# Patient Record
Sex: Female | Born: 1965 | Race: White | Hispanic: No | Marital: Married | Smoking: Never smoker
Health system: Southern US, Community
[De-identification: ages and names within clinical notes are randomized; demographics above are authoritative.]

## PROBLEM LIST (undated history)

## (undated) DIAGNOSIS — M8580 Other specified disorders of bone density and structure, unspecified site: Secondary | ICD-10-CM

## (undated) DIAGNOSIS — H04123 Dry eye syndrome of bilateral lacrimal glands: Secondary | ICD-10-CM

## (undated) DIAGNOSIS — N3281 Overactive bladder: Secondary | ICD-10-CM

## (undated) DIAGNOSIS — Z319 Encounter for procreative management, unspecified: Secondary | ICD-10-CM

## (undated) DIAGNOSIS — R112 Nausea with vomiting, unspecified: Secondary | ICD-10-CM

## (undated) DIAGNOSIS — K449 Diaphragmatic hernia without obstruction or gangrene: Secondary | ICD-10-CM

## (undated) DIAGNOSIS — R7689 Other specified abnormal immunological findings in serum: Secondary | ICD-10-CM

## (undated) DIAGNOSIS — E079 Disorder of thyroid, unspecified: Secondary | ICD-10-CM

## (undated) DIAGNOSIS — K589 Irritable bowel syndrome without diarrhea: Secondary | ICD-10-CM

## (undated) DIAGNOSIS — K219 Gastro-esophageal reflux disease without esophagitis: Secondary | ICD-10-CM

## (undated) DIAGNOSIS — Z9889 Other specified postprocedural states: Secondary | ICD-10-CM

## (undated) DIAGNOSIS — R768 Other specified abnormal immunological findings in serum: Secondary | ICD-10-CM

## (undated) HISTORY — DX: Diaphragmatic hernia without obstruction or gangrene: K44.9

## (undated) HISTORY — PX: URETHRAL DILATION: SUR417

## (undated) HISTORY — DX: Disorder of thyroid, unspecified: E07.9

## (undated) HISTORY — DX: Encounter for procreative management, unspecified: Z31.9

## (undated) HISTORY — DX: Irritable bowel syndrome without diarrhea: K58.9

## (undated) HISTORY — PX: LAPAROSCOPY: SHX197

## (undated) HISTORY — PX: CERVIX SURGERY: SHX593

## (undated) HISTORY — DX: Other specified disorders of bone density and structure, unspecified site: M85.80

## (undated) HISTORY — PX: HERNIA REPAIR: SHX51

## (undated) HISTORY — DX: Dry eye syndrome of bilateral lacrimal glands: H04.123

## (undated) HISTORY — DX: Overactive bladder: N32.81

## (undated) HISTORY — DX: Other specified abnormal immunological findings in serum: R76.8

## (undated) HISTORY — PX: UPPER GASTROINTESTINAL ENDOSCOPY: SHX188

## (undated) HISTORY — DX: Irritable bowel syndrome, unspecified: K58.9

## (undated) HISTORY — DX: Gastro-esophageal reflux disease without esophagitis: K21.9

## (undated) HISTORY — DX: Other specified abnormal immunological findings in serum: R76.89

---

## 1997-08-09 ENCOUNTER — Ambulatory Visit (HOSPITAL_COMMUNITY): Admission: RE | Admit: 1997-08-09 | Discharge: 1997-08-09 | Payer: Self-pay | Admitting: Obstetrics & Gynecology

## 1997-09-25 ENCOUNTER — Emergency Department (HOSPITAL_COMMUNITY): Admission: EM | Admit: 1997-09-25 | Discharge: 1997-09-25 | Payer: Self-pay | Admitting: Emergency Medicine

## 1998-11-14 ENCOUNTER — Other Ambulatory Visit: Admission: RE | Admit: 1998-11-14 | Discharge: 1998-11-14 | Payer: Self-pay | Admitting: Obstetrics & Gynecology

## 1999-12-22 ENCOUNTER — Other Ambulatory Visit: Admission: RE | Admit: 1999-12-22 | Discharge: 1999-12-22 | Payer: Self-pay | Admitting: Obstetrics & Gynecology

## 2001-01-26 ENCOUNTER — Other Ambulatory Visit: Admission: RE | Admit: 2001-01-26 | Discharge: 2001-01-26 | Payer: Self-pay | Admitting: Obstetrics & Gynecology

## 2002-02-16 ENCOUNTER — Other Ambulatory Visit: Admission: RE | Admit: 2002-02-16 | Discharge: 2002-02-16 | Payer: Self-pay | Admitting: Obstetrics & Gynecology

## 2002-05-04 ENCOUNTER — Ambulatory Visit (HOSPITAL_COMMUNITY): Admission: RE | Admit: 2002-05-04 | Discharge: 2002-05-04 | Payer: Self-pay | Admitting: Internal Medicine

## 2002-05-04 ENCOUNTER — Encounter: Payer: Self-pay | Admitting: Internal Medicine

## 2003-07-08 ENCOUNTER — Encounter: Admission: RE | Admit: 2003-07-08 | Discharge: 2003-07-08 | Payer: Self-pay | Admitting: Internal Medicine

## 2004-04-01 ENCOUNTER — Other Ambulatory Visit: Admission: RE | Admit: 2004-04-01 | Discharge: 2004-04-01 | Payer: Self-pay | Admitting: Gynecology

## 2004-04-06 ENCOUNTER — Ambulatory Visit: Payer: Self-pay | Admitting: Gastroenterology

## 2004-04-22 ENCOUNTER — Ambulatory Visit: Payer: Self-pay | Admitting: Gastroenterology

## 2004-05-22 ENCOUNTER — Ambulatory Visit: Payer: Self-pay | Admitting: Gastroenterology

## 2004-06-23 ENCOUNTER — Ambulatory Visit: Payer: Self-pay | Admitting: Gastroenterology

## 2004-06-24 ENCOUNTER — Ambulatory Visit (HOSPITAL_BASED_OUTPATIENT_CLINIC_OR_DEPARTMENT_OTHER): Admission: RE | Admit: 2004-06-24 | Discharge: 2004-06-24 | Payer: Self-pay | Admitting: Urology

## 2004-06-24 ENCOUNTER — Ambulatory Visit (HOSPITAL_COMMUNITY): Admission: RE | Admit: 2004-06-24 | Discharge: 2004-06-24 | Payer: Self-pay | Admitting: Urology

## 2004-07-16 ENCOUNTER — Ambulatory Visit: Payer: Self-pay | Admitting: Family Medicine

## 2004-07-22 ENCOUNTER — Ambulatory Visit: Payer: Self-pay | Admitting: Family Medicine

## 2004-10-02 ENCOUNTER — Ambulatory Visit: Payer: Self-pay | Admitting: Family Medicine

## 2005-01-26 ENCOUNTER — Ambulatory Visit: Payer: Self-pay | Admitting: Family Medicine

## 2005-02-01 ENCOUNTER — Ambulatory Visit: Payer: Self-pay | Admitting: Internal Medicine

## 2005-04-27 ENCOUNTER — Other Ambulatory Visit: Admission: RE | Admit: 2005-04-27 | Discharge: 2005-04-27 | Payer: Self-pay | Admitting: Gynecology

## 2005-05-19 ENCOUNTER — Ambulatory Visit (HOSPITAL_COMMUNITY): Admission: RE | Admit: 2005-05-19 | Discharge: 2005-05-19 | Payer: Self-pay | Admitting: Family Medicine

## 2005-07-16 ENCOUNTER — Ambulatory Visit: Payer: Self-pay | Admitting: Family Medicine

## 2005-07-21 ENCOUNTER — Ambulatory Visit: Payer: Self-pay | Admitting: Family Medicine

## 2005-07-23 ENCOUNTER — Ambulatory Visit: Payer: Self-pay | Admitting: Family Medicine

## 2006-05-11 ENCOUNTER — Other Ambulatory Visit: Admission: RE | Admit: 2006-05-11 | Discharge: 2006-05-11 | Payer: Self-pay | Admitting: Gynecology

## 2006-05-20 ENCOUNTER — Ambulatory Visit (HOSPITAL_COMMUNITY): Admission: RE | Admit: 2006-05-20 | Discharge: 2006-05-20 | Payer: Self-pay | Admitting: Gynecology

## 2006-08-05 ENCOUNTER — Ambulatory Visit: Payer: Self-pay | Admitting: Family Medicine

## 2006-08-05 LAB — CONVERTED CEMR LAB
ALT: 14 units/L (ref 0–40)
AST: 18 units/L (ref 0–37)
Basophils Relative: 0.8 % (ref 0.0–1.0)
Bilirubin, Direct: 0.1 mg/dL (ref 0.0–0.3)
CO2: 30 meq/L (ref 19–32)
Calcium: 10.4 mg/dL (ref 8.4–10.5)
Chloride: 109 meq/L (ref 96–112)
Eosinophils Relative: 1.5 % (ref 0.0–5.0)
Glucose, Bld: 98 mg/dL (ref 70–99)
HCT: 39.3 % (ref 36.0–46.0)
HDL: 81.9 mg/dL (ref >39.0)
Lymphocytes Relative: 30.8 % (ref 12.0–46.0)
Platelets: 256 10*3/uL (ref 150–400)
RBC: 4.36 M/uL (ref 3.87–5.11)
Total Bilirubin: 0.7 mg/dL (ref 0.3–1.2)
Total Protein: 7.3 g/dL (ref 6.0–8.3)
Triglycerides: 69 mg/dL (ref 0–149)
VLDL: 14 mg/dL (ref 0–40)
WBC: 4.6 10*3/uL (ref 4.5–10.5)

## 2006-08-12 ENCOUNTER — Ambulatory Visit: Payer: Self-pay | Admitting: Family Medicine

## 2006-12-05 ENCOUNTER — Telehealth: Payer: Self-pay | Admitting: Family Medicine

## 2006-12-08 ENCOUNTER — Ambulatory Visit: Payer: Self-pay | Admitting: Family Medicine

## 2006-12-08 DIAGNOSIS — K219 Gastro-esophageal reflux disease without esophagitis: Secondary | ICD-10-CM | POA: Insufficient documentation

## 2007-03-30 HISTORY — PX: ABDOMINAL HYSTERECTOMY: SHX81

## 2007-05-22 ENCOUNTER — Other Ambulatory Visit: Admission: RE | Admit: 2007-05-22 | Discharge: 2007-05-22 | Payer: Self-pay | Admitting: Gynecology

## 2007-06-06 ENCOUNTER — Ambulatory Visit (HOSPITAL_COMMUNITY): Admission: RE | Admit: 2007-06-06 | Discharge: 2007-06-06 | Payer: Self-pay | Admitting: Gynecology

## 2007-09-13 ENCOUNTER — Ambulatory Visit: Payer: Self-pay | Admitting: Family Medicine

## 2007-09-13 LAB — CONVERTED CEMR LAB
Bilirubin Urine: NEGATIVE
Protein, U semiquant: NEGATIVE
Urobilinogen, UA: 0.2
WBC Urine, dipstick: NEGATIVE

## 2007-09-18 LAB — CONVERTED CEMR LAB
ALT: 18 units/L (ref 0–35)
AST: 21 units/L (ref 0–37)
Alkaline Phosphatase: 52 units/L (ref 39–117)
Basophils Absolute: 0 10*3/uL (ref 0.0–0.1)
Basophils Relative: 0 % (ref 0.0–1.0)
Bilirubin, Direct: 0.1 mg/dL (ref 0.0–0.3)
CO2: 29 meq/L (ref 19–32)
Chloride: 107 meq/L (ref 96–112)
Cholesterol: 191 mg/dL (ref 0–200)
Creatinine, Ser: 0.8 mg/dL (ref 0.4–1.2)
LDL Cholesterol: 107 mg/dL — ABNORMAL HIGH (ref 0–99)
Lymphocytes Relative: 32.6 % (ref 12.0–46.0)
MCHC: 34.2 g/dL (ref 30.0–36.0)
Neutrophils Relative %: 60.6 % (ref 43.0–77.0)
Potassium: 4.1 meq/L (ref 3.5–5.1)
RBC: 4.22 M/uL (ref 3.87–5.11)
Sodium: 141 meq/L (ref 135–145)
Total Bilirubin: 0.7 mg/dL (ref 0.3–1.2)
VLDL: 13 mg/dL (ref 0–40)

## 2007-09-27 ENCOUNTER — Ambulatory Visit: Payer: Self-pay | Admitting: Family Medicine

## 2007-09-27 DIAGNOSIS — M8580 Other specified disorders of bone density and structure, unspecified site: Secondary | ICD-10-CM

## 2007-09-28 ENCOUNTER — Ambulatory Visit: Payer: Self-pay | Admitting: Internal Medicine

## 2007-09-28 ENCOUNTER — Encounter: Payer: Self-pay | Admitting: Family Medicine

## 2008-06-06 ENCOUNTER — Ambulatory Visit (HOSPITAL_COMMUNITY): Admission: RE | Admit: 2008-06-06 | Discharge: 2008-06-06 | Payer: Self-pay | Admitting: Gynecology

## 2008-09-23 ENCOUNTER — Ambulatory Visit: Payer: Self-pay | Admitting: Family Medicine

## 2008-09-23 LAB — CONVERTED CEMR LAB
Blood in Urine, dipstick: NEGATIVE
Nitrite: NEGATIVE
Protein, U semiquant: NEGATIVE
Urobilinogen, UA: 0.2
WBC Urine, dipstick: NEGATIVE

## 2008-09-27 ENCOUNTER — Encounter: Payer: Self-pay | Admitting: Family Medicine

## 2008-09-27 LAB — CONVERTED CEMR LAB
AST: 21 units/L (ref 0–37)
Albumin: 4.1 g/dL (ref 3.5–5.2)
Alkaline Phosphatase: 53 units/L (ref 39–117)
Basophils Absolute: 0 10*3/uL (ref 0.0–0.1)
Bilirubin, Direct: 0.1 mg/dL (ref 0.0–0.3)
Calcium: 10.3 mg/dL (ref 8.4–10.5)
Creatinine, Ser: 0.8 mg/dL (ref 0.4–1.2)
Eosinophils Absolute: 0.1 10*3/uL (ref 0.0–0.7)
GFR calc non Af Amer: 82.95 mL/min (ref >60)
Glucose, Bld: 93 mg/dL (ref 70–99)
HDL: 82.8 mg/dL (ref >39.00)
Hemoglobin: 12.9 g/dL (ref 12.0–15.0)
Lymphocytes Relative: 18.3 % (ref 12.0–46.0)
Monocytes Relative: 5.9 % (ref 3.0–12.0)
Neutro Abs: 3.5 10*3/uL (ref 1.4–7.7)
Neutrophils Relative %: 74.5 % (ref 43.0–77.0)
RBC: 4.08 M/uL (ref 3.87–5.11)
RDW: 12.5 % (ref 11.5–14.6)
Sodium: 143 meq/L (ref 135–145)
Total CHOL/HDL Ratio: 2
Triglycerides: 61 mg/dL (ref 0.0–149.0)
VLDL: 12.2 mg/dL (ref 0.0–40.0)

## 2008-10-08 ENCOUNTER — Ambulatory Visit: Payer: Self-pay | Admitting: Family Medicine

## 2008-10-17 ENCOUNTER — Encounter: Payer: Self-pay | Admitting: Family Medicine

## 2008-12-30 ENCOUNTER — Encounter: Payer: Self-pay | Admitting: Family Medicine

## 2009-02-05 ENCOUNTER — Encounter (INDEPENDENT_AMBULATORY_CARE_PROVIDER_SITE_OTHER): Payer: Self-pay | Admitting: *Deleted

## 2009-03-07 ENCOUNTER — Encounter: Payer: Self-pay | Admitting: Family Medicine

## 2009-03-12 ENCOUNTER — Encounter (INDEPENDENT_AMBULATORY_CARE_PROVIDER_SITE_OTHER): Payer: Self-pay | Admitting: *Deleted

## 2009-03-31 ENCOUNTER — Encounter (INDEPENDENT_AMBULATORY_CARE_PROVIDER_SITE_OTHER): Payer: Self-pay | Admitting: *Deleted

## 2009-04-16 ENCOUNTER — Ambulatory Visit: Payer: Self-pay | Admitting: Family Medicine

## 2009-04-16 LAB — CONVERTED CEMR LAB
Bilirubin Urine: NEGATIVE
Blood in Urine, dipstick: NEGATIVE
Ketones, urine, test strip: NEGATIVE
Nitrite: NEGATIVE
Protein, U semiquant: NEGATIVE
Urobilinogen, UA: 0.2

## 2009-04-25 ENCOUNTER — Encounter: Payer: Self-pay | Admitting: Family Medicine

## 2009-05-26 ENCOUNTER — Encounter: Payer: Self-pay | Admitting: Family Medicine

## 2009-05-28 ENCOUNTER — Encounter (INDEPENDENT_AMBULATORY_CARE_PROVIDER_SITE_OTHER): Payer: Self-pay | Admitting: *Deleted

## 2009-05-30 ENCOUNTER — Ambulatory Visit: Payer: Self-pay | Admitting: Gastroenterology

## 2009-06-13 ENCOUNTER — Ambulatory Visit (HOSPITAL_COMMUNITY): Admission: RE | Admit: 2009-06-13 | Discharge: 2009-06-13 | Payer: Self-pay | Admitting: Gynecology

## 2009-06-23 ENCOUNTER — Telehealth: Payer: Self-pay | Admitting: Gastroenterology

## 2009-06-26 ENCOUNTER — Ambulatory Visit: Payer: Self-pay | Admitting: Gastroenterology

## 2009-06-26 HISTORY — PX: COLONOSCOPY: SHX174

## 2009-10-06 ENCOUNTER — Ambulatory Visit: Payer: Self-pay | Admitting: Family Medicine

## 2009-10-06 LAB — CONVERTED CEMR LAB
Bilirubin Urine: NEGATIVE
Blood in Urine, dipstick: NEGATIVE
Glucose, Urine, Semiquant: NEGATIVE
Ketones, urine, test strip: NEGATIVE
Protein, U semiquant: NEGATIVE
Urobilinogen, UA: 0.2
pH: 5.5

## 2009-10-08 ENCOUNTER — Encounter (INDEPENDENT_AMBULATORY_CARE_PROVIDER_SITE_OTHER): Payer: Self-pay

## 2009-10-08 LAB — CONVERTED CEMR LAB
Alkaline Phosphatase: 55 units/L (ref 39–117)
BUN: 14 mg/dL (ref 6–23)
Basophils Relative: 0.6 % (ref 0.0–3.0)
Bilirubin, Direct: 0.1 mg/dL (ref 0.0–0.3)
Calcium: 10.1 mg/dL (ref 8.4–10.5)
Chloride: 106 meq/L (ref 96–112)
Cholesterol: 214 mg/dL — ABNORMAL HIGH (ref 0–200)
Creatinine, Ser: 0.7 mg/dL (ref 0.4–1.2)
Direct LDL: 96.7 mg/dL
Eosinophils Absolute: 0 10*3/uL (ref 0.0–0.7)
Eosinophils Relative: 1 % (ref 0.0–5.0)
HDL: 91.9 mg/dL (ref >39.00)
Lymphocytes Relative: 26.2 % (ref 12.0–46.0)
MCV: 93.6 fL (ref 78.0–100.0)
Monocytes Absolute: 0.2 10*3/uL (ref 0.1–1.0)
Neutrophils Relative %: 66.9 % (ref 43.0–77.0)
Platelets: 217 10*3/uL (ref 150.0–400.0)
RBC: 3.94 M/uL (ref 3.87–5.11)
Total Bilirubin: 0.7 mg/dL (ref 0.3–1.2)
Total CHOL/HDL Ratio: 2
Triglycerides: 70 mg/dL (ref 0.0–149.0)
VLDL: 14 mg/dL (ref 0.0–40.0)
WBC: 4.5 10*3/uL (ref 4.5–10.5)

## 2009-10-13 ENCOUNTER — Ambulatory Visit: Payer: Self-pay | Admitting: Family Medicine

## 2009-10-14 ENCOUNTER — Encounter: Payer: Self-pay | Admitting: Family Medicine

## 2009-10-15 LAB — CONVERTED CEMR LAB: Vit D, 25-Hydroxy: 47 ng/mL (ref 30–89)

## 2010-04-30 NOTE — Progress Notes (Signed)
Summary: speak to nurse  Phone Note Call from Patient Call back at Home Phone [phone]   Caller: Patient Call For: Russella Dar Reason for Call: Talk to Nurse Summary of Call: Patient wants to speak to nurse regarding prep before procedure. Initial call taken by: Tawni Levy,  June 23, 2009 11:12 AM  Follow-up for Phone Call        No answer, will call later. Follow-up by: Wyona Almas RN,  June 23, 2009 11:34 AM  Additional Follow-up for Phone Call Additional follow up Details #1::        Phone Call Completed Additional Follow-up by: Wyona Almas RN,  June 23, 2009 1:00 PM

## 2010-04-30 NOTE — Letter (Signed)
Summary: [facility]  [facility]   Imported By: Maryln Gottron 06/10/2009 15:51:55  _____________________________________________________________________  External Attachment:    Type:   Image     Comment:   External Document

## 2010-04-30 NOTE — Letter (Signed)
Summary: [facility]  [facility]   Imported By: Maryln Gottron 04/03/2009 10:51:03  _____________________________________________________________________  External Attachment:    Type:   Image     Comment:   External Document

## 2010-04-30 NOTE — Procedures (Signed)
Summary: Colonoscopy  Patient: [name] Note: All result statuses are Final unless otherwise noted.  Tests: (1) Colonoscopy (COL)   COL Colonoscopy           DONE     [facility]     [address]     [place], [place]  [address]           COLONOSCOPY PROCEDURE REPORT           PATIENT:  Sherri Charles, Sherri Charles  MR#:  [id number]     BIRTHDATE:  1965-10-05, 43 yrs. old  GENDER:  female           ENDOSCOPIST:  Judie Petit T. Russella Dar, MD, [facility]           PROCEDURE DATE:  06/26/2009     PROCEDURE:  Colonoscopy 08657     ASA CLASS:  Class II     INDICATIONS:  1) Routine Risk Screening           MEDICATIONS:   Fentanyl 75 mcg IV, Versed 8 mg IV           DESCRIPTION OF PROCEDURE:   After the risks benefits and     alternatives of the procedure were thoroughly explained, informed     consent was obtained.  Digital rectal exam was performed and     revealed no abnormalities.   The LB [id number]-[id number] endoscope     was introduced through the anus and advanced to the cecum, which     was identified by both the appendix and ileocecal valve, without     limitations.  The quality of the prep was excellent, using     MoviPrep.  The instrument was then slowly withdrawn as the colon     was fully examined.     <<PROCEDUREIMAGES>>           FINDINGS:  A normal appearing cecum, ileocecal valve, and     appendiceal orifice were identified. The ascending, hepatic     flexure, transverse, splenic flexure, descending, sigmoid colon,     and rectum appeared unremarkable. Retroflexed views in the rectum     revealed no abnormalities. The time to cecum =  3  minutes. The     scope was then withdrawn (time =  8.25  min) from the patient and     the procedure completed.           COMPLICATIONS:  None           ENDOSCOPIC IMPRESSION:     1) Normal colon           RECOMMENDATIONS:     1) Continue current colorectal screening recommendations for     "routine risk" patients with a repeat colonoscopy in  10 years.           Venita Lick. Russella Dar, MD, [facility]           CC: Nelwyn Salisbury, MD           n.     Rosalie DoctorVenita Lick. Maryrose Colvin at 06/26/2009 12:06 PM           Oliva Bustard, [id number]  Note: An exclamation mark (!) indicates a result that was not dispersed into the flowsheet. Document Creation Date: 06/26/2009 12:06 PM _______________________________________________________________________  (1) Order result status: Final Collection or observation date-time: 06/26/2009 12:03 Requested date-time:  Receipt date-time:  Reported date-time:  Referring Physician:   Ordering Physician: Claudette Head [phone]) Specimen Source:  Source: Launa Grill Order Number: [id number] Lab site:   Appended Document: Colonoscopy    Clinical Lists Changes  Observations: Added new observation of COLONNXTDUE: 05/2019 (06/26/2009 12:50)

## 2010-04-30 NOTE — Letter (Signed)
Summary: [facility] Instructions  [facility]  [address] [place], [place] [address]   Phone: [phone]  Fax: [phone]       Sherri Charles    September 09, 1965    MRN: [id number]        Procedure Day /Date: Thursday 06/26/2009     Arrival Time: 10:30 am      Procedure Time: 11:30 am     Location of Procedure:                    _x _  [facility] (4th Floor)                        PREPARATION FOR COLONOSCOPY WITH MOVIPREP   Starting 5 days prior to your procedure Saturday 3/26 do not eat nuts, seeds, popcorn, corn, beans, peas,  salads, or any raw vegetables.  Do not take any fiber supplements (e.g. Metamucil, Citrucel, and Benefiber).  THE DAY BEFORE YOUR PROCEDURE         DATE: Wednesday 3/30  1.  Drink clear liquids the entire day-NO SOLID FOOD  2.  Do not drink anything colored red or purple.  Avoid juices with pulp.  No orange juice.  3.  Drink at least 64 oz. (8 glasses) of fluid/clear liquids during the day to prevent dehydration and help the prep work efficiently.  CLEAR LIQUIDS INCLUDE: Water Jello Ice Popsicles Tea (sugar ok, no milk/cream) Powdered fruit flavored drinks Coffee (sugar ok, no milk/cream) Gatorade Juice: apple, white grape, white cranberry  Lemonade Clear bullion, consomm, broth Carbonated beverages (any kind) Strained chicken noodle soup Hard Candy                             4.  In the morning, mix first dose of MoviPrep solution:    Empty 1 Pouch A and 1 Pouch B into the disposable container    Add lukewarm drinking water to the top line of the container. Mix to dissolve    Refrigerate (mixed solution should be used within 24 hrs)  5.  Begin drinking the prep at 5:00 p.m. The MoviPrep container is divided by 4 marks.   Every 15 minutes drink the solution down to the next mark (approximately 8 oz) until the full liter is complete.   6.  Follow completed prep with 16 oz of clear liquid of your choice  (Nothing red or purple).  Continue to drink clear liquids until bedtime.  7.  Before going to bed, mix second dose of MoviPrep solution:    Empty 1 Pouch A and 1 Pouch B into the disposable container    Add lukewarm drinking water to the top line of the container. Mix to dissolve    Refrigerate  THE DAY OF YOUR PROCEDURE      DATE: Thursday 3/31  Beginning at 6:30 a.m. (5 hours before procedure):         1. Every 15 minutes, drink the solution down to the next mark (approx 8 oz) until the full liter is complete.  2. Follow completed prep with 16 oz. of clear liquid of your choice.    3. You may drink clear liquids until 9:30 am (2 HOURS BEFORE PROCEDURE).   MEDICATION INSTRUCTIONS  Unless otherwise instructed, you should take regular prescription medications with a small sip of water   as early as possible the morning of your  procedure.        OTHER INSTRUCTIONS  You will need a responsible adult at least 46 years of age to accompany you and drive you home.   This person must remain in the waiting room during your procedure.  Wear loose fitting clothing that is easily removed.  Leave jewelry and other valuables at home.  However, you may wish to bring a book to read or  an iPod/MP3 player to listen to music as you wait for your procedure to start.  Remove all body piercing jewelry and leave at home.  Total time from sign-in until discharge is approximately 2-3 hours.  You should go home directly after your procedure and rest.  You can resume normal activities the  day after your procedure.  The day of your procedure you should not:   Drive   Make legal decisions   Operate machinery   Drink alcohol   Return to work  You will receive specific instructions about eating, activities and medications before you leave.    The above instructions have been reviewed and explained to me by   Clide Cliff, RN_______________________    I fully understand and can  verbalize these instructions _____________________________ Date _________

## 2010-04-30 NOTE — Assessment & Plan Note (Signed)
Summary: consult re: recurring pain in lower back and hip area/over a ...   Vital Signs:  Patient profile:   45 year old female Weight:      105 pounds Temp:     98.4 degrees F oral Pulse rate:   94 / minute BP sitting:   102 / 70  (left arm) Cuff size:   regular  Vitals Entered By: Alfred Levins, CMA (April 16, 2009 2:37 PM) CC: rt side pain no better   History of Present Illness: Here for 2 different pains that have been  bothering her for 6 months off and on. It started with a sharp burning pain in the right lateral thigh that still hurts, especially when she lies on it. No trauma hx. Then she began to have a more dull aching pain in the lower right back area that comes and goes. No pain or numbness down the lower leg. Motrin does not help much.   Allergies: 1)  ! Sulfa  Past History:  Past Medical History: Reviewed history from 09/27/2007 and no changes required. GERD sees Dr. Chevis Pretty for gyn exams overactive bladder, sees Dr. Earlene Plater IBS infertility, sees Dr. Chevis Pretty dry eyes, sees Dr. Marti Sleigh sees Dr. Jorja Loa for skin checks osteopenia per DEXA on 02-01-05  Past Surgical History: Reviewed history from 10/08/2008 and no changes required. Laporoscopy x 2 Lesion removed from cervix right inguinal Hernia repair urethral dilatation times two (last 5-10) EGD 04-22-04 per Dr. Russella Dar, showed esophagitis colonoscopy 04-22-04 per Dr. Russella Dar, had polyps, repeat in 5 years TAH and left SO 06-20-07 per Dr. Randell Patient  Review of Systems  The patient denies anorexia, fever, weight loss, weight gain, vision loss, decreased hearing, hoarseness, chest pain, syncope, dyspnea on exertion, peripheral edema, prolonged cough, headaches, hemoptysis, abdominal pain, melena, hematochezia, severe indigestion/heartburn, hematuria, incontinence, genital sores, muscle weakness, suspicious skin lesions, transient blindness, difficulty walking, depression, unusual weight change, abnormal bleeding, enlarged lymph  nodes, angioedema, breast masses, and testicular masses.    Physical Exam  General:  Well-developed,well-nourished,in no acute distress; alert,appropriate and cooperative throughout examination. Walks easily Msk:  No deformity or scoliosis noted of thoracic or lumbar spine.  No tenderness in the lower back at all, with full ROM. Both hips show full ROM without pain. She is quite tender over the right lateral thigh.    Impression & Recommendations:  Problem # 1:  GREATER TROCHANTERIC BURSITIS (ICD-726.5)  Orders: Orthopedic Referral (Ortho)  Problem # 2:  BACK PAIN, LUMBAR (ICD-724.2)  Complete Medication List: 1)  Multivitamins Tabs (Multiple vitamin) .Marland Kitchen.. 1 by mouth once daily 2)  Calcium Carbonate Powd (Calcium carbonate) .Marland Kitchen.. 1 by mouth two times a day 3)  Restasis 0.05 % Emul (Cyclosporine) .... 2 drops each two times a day 4)  Fish Oil Oil (Fish oil) .... Once daily  Other Orders: UA Dipstick w/o Micro (manual) (40981)  Patient Instructions: 1)  I think the original problem is the trochanteric bursitis, then she developed low back pain from an altered gait ti favor the right hip. rest , heat, Motrin. Will refer to Orthopedics for a cortisone injection in the bursa.  Laboratory Results   Urine Tests    Routine Urinalysis   Color: yellow Appearance: Clear Glucose: negative   (Normal Range: Negative) Bilirubin: negative   (Normal Range: Negative) Ketone: negative   (Normal Range: Negative) Spec. Gravity: 1.015   (Normal Range: 1.003-1.035) Blood: negative   (Normal Range: Negative) pH: 7.5   (Normal Range: 5.0-8.0) Protein: negative   (  Normal Range: Negative) Urobilinogen: 0.2   (Normal Range: 0-1) Nitrite: negative   (Normal Range: Negative) Leukocyte Esterace: negative   (Normal Range: Negative)    Comments: Rita Ohara  April 16, 2009 3:03 PM

## 2010-04-30 NOTE — Consult Note (Signed)
Summary: [facility]  [facility]   Imported By: Maryln Gottron 05/02/2009 13:21:51  _____________________________________________________________________  External Attachment:    Type:   Image     Comment:   External Document

## 2010-04-30 NOTE — Letter (Signed)
Summary: Previsit letter  [facility]  [address] [place], [place] [address]   Phone: [phone]  Fax: [phone]       03/31/2009 MRN: [id number]  [facility] [address] [place], [place]  [address]  Dear Ms. Sherri Charles,  Welcome to the Gastroenterology Division at [facility].    You are scheduled to see a nurse for your pre-procedure visit on 05/01/2009 at 3:00pm on the 3rd floor at [facility], [address]  We ask that you try to arrive at our office 15 minutes prior to your appointment time to allow for check-in.  Your nurse visit will consist of discussing your medical and surgical history, your immediate family medical history, and your medications.    Please bring a complete list of all your medications or, if you prefer, bring the medication bottles and we will list them.  We will need to be aware of both prescribed and over the counter drugs.  We will need to know exact dosage information as well.  If you are on blood thinners (Coumadin, Plavix, Aggrenox, Ticlid, etc.) please call our office today/prior to your appointment, as we need to consult with your physician about holding your medication.   Please be prepared to read and sign documents such as consent forms, a financial agreement, and acknowledgement forms.  If necessary, and with your consent, a friend or relative is welcome to sit-in on the nurse visit with you.  Please bring your insurance card so that we may make a copy of it.  If your insurance requires a referral to see a specialist, please bring your referral form from your primary care physician.  No co-pay is required for this nurse visit.     If you cannot keep your appointment, please call [phone] to cancel or reschedule prior to your appointment date.  This allows [place] the opportunity to schedule an appointment for another patient in need of care.    Thank you for choosing [facility] for your medical needs.  We  appreciate the opportunity to care for you.  Please visit [place] at our website  to learn more about our practice.                     Sincerely.                                                                                                                   The Gastroenterology Division

## 2010-04-30 NOTE — Letter (Signed)
Summary: Generic Letter  [place] [facility]  [address] [place], [place] [address]   Phone: [phone]  Fax: [phone]    10/08/2009  Akayla Mullings [address] [place], [place]  [address]  Dear Ms. Phylliss Bob,           Sincerely,   Tomma Lightning, RN

## 2010-04-30 NOTE — Miscellaneous (Signed)
Summary: RECALL COLON/YF  Clinical Lists Changes  Medications: Added new medication of MOVIPREP 100 GM  SOLR (PEG-KCL-NACL-NASULF-NA ASC-C) As directed - Signed Rx of MOVIPREP 100 GM  SOLR (PEG-KCL-NACL-NASULF-NA ASC-C) As directed;  #1 x 0;  Signed;  Entered by: Clide Cliff RN;  Authorized by: Meryl Dare MD [facility] Graham;  Method used: Electronically to [facility] [phone]*, [address], [place], [place]  [address], Ph: [phone], Fax: [phone] Allergies: Changed allergy or adverse reaction from SULFA to SULFA    Prescriptions: MOVIPREP 100 GM  SOLR (PEG-KCL-NACL-NASULF-NA ASC-C) As directed  #1 x 0   Entered by:   Clide Cliff RN   Authorized by:   Meryl Dare MD [facility]   Signed by:   Clide Cliff RN on 05/30/2009   Method used:   Electronically to        [facility] [phone]* (retail)       [address] [place], [place]  [address]       Ph: [phone]       Fax: [phone]   RxID:   [id number]

## 2010-04-30 NOTE — Assessment & Plan Note (Signed)
Summary: CPX // RS   Vital Signs:  Patient profile:   45 year old female Weight:      107 pounds BMI:     21.15 BP sitting:   100 / 64  (left arm) Cuff size:   regular  Vitals Entered By: Raechel Ache, RN (October 13, 2009 2:50 PM) CC: CPX, labs done, sees gyn. C/o trouble sleeping and overactive bladder at noc.   History of Present Illness: 45 yr old female for a cpx. She feels good in general but she does mention some mild muscle aches that can wake her up at night sometimes. These do not bother her during the day.   Allergies: 1)  ! Sulfa  Past History:  Past Medical History: GERD sees Dr. Chevis Pretty for gyn exams overactive bladder, sees Dr. Earlene Plater IBS infertility, sees Dr. Chevis Pretty dry eyes, sees Dr. Marti Sleigh sees Dr. Jorja Loa for skin checks osteopenia per DEXA on 09-28-07  Past Surgical History: Laporoscopy x 2 Lesion removed from cervix right inguinal Hernia repair urethral dilatation times two (last 5-10) EGD 04-22-04 per Dr. Russella Dar, showed esophagitis colonoscopy 06-26-09 per Dr. Russella Dar, clear, repeat in 10 yrs TAH and left SO 06-20-07 per Dr. Randell Patient  Family History: Reviewed history from 09/27/2007 and no changes required. Family History of Alcoholism/Addiction Family History of Arthritis Family History of CAD Female 1st degree relative <60 Family History Hypertension  Social History: Reviewed history from 09/27/2007 and no changes required. Married Never Smoked Alcohol use-yes adopted a child from [place] 2008  Review of Systems  The patient denies anorexia, fever, weight loss, weight gain, vision loss, decreased hearing, hoarseness, chest pain, syncope, dyspnea on exertion, peripheral edema, prolonged cough, headaches, hemoptysis, abdominal pain, melena, hematochezia, severe indigestion/heartburn, hematuria, incontinence, genital sores, muscle weakness, suspicious skin lesions, transient blindness, difficulty walking, depression, unusual weight change, abnormal  bleeding, enlarged lymph nodes, angioedema, breast masses, and testicular masses.    Physical Exam  General:  Well-developed,well-nourished,in no acute distress; alert,appropriate and cooperative throughout examination Head:  Normocephalic and atraumatic without obvious abnormalities. No apparent alopecia or balding. Eyes:  No corneal or conjunctival inflammation noted. EOMI. Perrla. Funduscopic exam benign, without hemorrhages, exudates or papilledema. Vision grossly normal. Ears:  External ear exam shows no significant lesions or deformities.  Otoscopic examination reveals clear canals, tympanic membranes are intact bilaterally without bulging, retraction, inflammation or discharge. Hearing is grossly normal bilaterally. Nose:  External nasal examination shows no deformity or inflammation. Nasal mucosa are pink and moist without lesions or exudates. Mouth:  Oral mucosa and oropharynx without lesions or exudates.  Teeth in good repair. Neck:  No deformities, masses, or tenderness noted. Chest Wall:  No deformities, masses, or tenderness noted. Lungs:  Normal respiratory effort, chest expands symmetrically. Lungs are clear to auscultation, no crackles or wheezes. Heart:  Normal rate and regular rhythm. S1 and S2 normal without gallop, murmur, click, rub or other extra sounds. Abdomen:  Bowel sounds positive,abdomen soft and non-tender without masses, organomegaly or hernias noted. Msk:  No deformity or scoliosis noted of thoracic or lumbar spine.   Pulses:  R and L carotid,radial,femoral,dorsalis pedis and posterior tibial pulses are full and equal bilaterally Extremities:  No clubbing, cyanosis, edema, or deformity noted with normal full range of motion of all joints.   Neurologic:  No cranial nerve deficits noted. Station and gait are normal. Plantar reflexes are down-going bilaterally. DTRs are symmetrical throughout. Sensory, motor and coordinative functions appear intact. Skin:  Intact  without suspicious lesions or  rashes Cervical Nodes:  No lymphadenopathy noted Axillary Nodes:  No palpable lymphadenopathy Inguinal Nodes:  No significant adenopathy Psych:  Cognition and judgment appear intact. Alert and cooperative with normal attention span and concentration. No apparent delusions, illusions, hallucinations   Impression & Recommendations:  Problem # 1:  WELL ADULT EXAM (ICD-V70.0)  Orders: Venipuncture ([address]) T-Vitamin D (25-Hydroxy) (60454-09811)  Complete Medication List: 1)  Multivitamins Tabs (Multiple vitamin) .Marland Kitchen.. 1 by mouth once daily 2)  Calcium Carbonate Powd (Calcium carbonate) .Marland Kitchen.. 1 by mouth two times a day 3)  Restasis 0.05 % Emul (Cyclosporine) .... 2 drops each two times a day 4)  Fish Oil Oil (Fish oil) .... 2 two times a day  Patient Instructions: 1)  Check a vitamin D level.  2)  Please schedule a follow-up appointment in 1 year.    Preventive Care Screening  Mammogram:    Date:  06/27/2009    Results:  normal

## 2010-07-28 LAB — HM MAMMOGRAPHY

## 2010-07-30 ENCOUNTER — Other Ambulatory Visit (HOSPITAL_COMMUNITY): Payer: Self-pay | Admitting: Gynecology

## 2010-08-03 ENCOUNTER — Other Ambulatory Visit: Payer: Self-pay | Admitting: Gynecology

## 2010-08-03 DIAGNOSIS — R928 Other abnormal and inconclusive findings on diagnostic imaging of breast: Secondary | ICD-10-CM

## 2010-08-04 ENCOUNTER — Other Ambulatory Visit (HOSPITAL_COMMUNITY): Payer: Self-pay

## 2010-08-04 ENCOUNTER — Other Ambulatory Visit: Payer: Self-pay

## 2010-08-06 ENCOUNTER — Ambulatory Visit (HOSPITAL_COMMUNITY)
Admission: RE | Admit: 2010-08-06 | Discharge: 2010-08-06 | Disposition: A | Discharge status: Home / Self Care | Payer: BC Managed Care – PPO | Source: Ambulatory Visit | Attending: Gynecology | Admitting: Gynecology

## 2010-08-06 DIAGNOSIS — E042 Nontoxic multinodular goiter: Secondary | ICD-10-CM | POA: Insufficient documentation

## 2010-08-10 ENCOUNTER — Ambulatory Visit
Admission: RE | Admit: 2010-08-10 | Discharge: 2010-08-10 | Disposition: A | Discharge status: Home / Self Care | Payer: BC Managed Care – PPO | Source: Ambulatory Visit | Attending: Gynecology | Admitting: Gynecology

## 2010-08-10 DIAGNOSIS — R928 Other abnormal and inconclusive findings on diagnostic imaging of breast: Secondary | ICD-10-CM

## 2010-08-11 NOTE — Assessment & Plan Note (Signed)
[facility] OFFICE NOTE   PORSCHE, NOGUCHI                         MRN:          [id number]  DATE:08/12/2006                            DOB:          July 17, 1965    This is a 45 year old woman here for a non-gynecological physical  examination.  She has a couple of issues to discuss.  First off, she has  been trying to get some exercise and has been walking on a treadmill a  lot.  She frequently gets some swelling and pain along both anterior  lower legs and she thinks it may be shin splints.  She has done nothing  about it thus far.  Also, she had been on Nexium in the past with good  control of acid reflux.  She recently tried some over-the-counter  omeprazole, but began having a lot of problems with burning and dryness  of her eyes.  She recently saw her optometrist, who suggested that this  was a common side of omeprazole. She wonders if she should change back  to Nexium.  Lastly, she has frequent mild pains diffusely in her body,  especially in the neck, the lower back, the hips and the legs.  She  takes Tylenol for it with mixed results.  Otherwise, she and her husband  are still waiting to adopt a baby from [place].  They hope that later this  year this process will move forward and they can actually travel to  [place] for the next step of the process.  She continues to see Dr. Earlene Plater  on a regular basis for urologic concerns and she sees Dr. Jackelyn Knife for  GYN concerns.  For other details of her past medical history, family  history, social history, habits, etc., refer to our last physical noted  dated July 23, 2005.   ALLERGIES:  SULFA   CURRENT MEDICATIONS:  1. Calcium and vitamin D daily.  2. Multivitamins daily.  3. Omeprazole over-the-counter as needed.   OBJECTIVE:  Height 5 foot 1 inch, weight 112. BP 118/64, pulse 88 and  regular.  In general she appears to be healthy.  SKIN:  Is clear.  EYES:   Clear.  EARS:  Clear.  PHARYNX:  Clear.  NECK:  Supple without lymphadenopathy or masses.  LUNGS:  Clear.  CARDIAC:  Rate and rhythm regular without gallops, murmurs, rubs.  Distal pulses are full.  ABDOMEN:  Soft, normal bowel sounds, nontender, no masses.  EXTREMITIES:  No clubbing, cyanosis or edema.  No particular tenderness  along her shins either.  NEUROLOGIC:  Exam is grossly intact.   She was here for fasting labs on May 9, these were all within normal  limits.   ASSESSMENT AND PLAN:  1. Complete physical exam.  She seems to be doing quite well.  I      encouraged her exercise routine, but will alter it as noted below.  2. Arthralgias.  She can take Aleve on an as needed basis and follow      up p.r.n.  3. Gastroesophageal reflux disease.  Will get back on Nexium 40 mg      once daily.  4. Dry eyes, possibly due to omeprazole, hopefully this will improve      when we get back to Nexium as noted above.  5. Shin splints.  I advised alternative forms of exercising other than      walking, such as riding a stationary bicycle or an elliptical      machine.     Tera Mater. Clent Ridges, MD  Electronically Signed    SAF/MedQ  DD: 08/12/2006  DT: 08/12/2006  Job #: [id number]

## 2010-08-12 ENCOUNTER — Encounter: Payer: Self-pay | Admitting: Family Medicine

## 2010-08-12 ENCOUNTER — Ambulatory Visit (INDEPENDENT_AMBULATORY_CARE_PROVIDER_SITE_OTHER): Payer: BC Managed Care – PPO | Admitting: Family Medicine

## 2010-08-12 VITALS — BP 110/76 | Temp 99.1°F | Ht 60.0 in | Wt 101.0 lb

## 2010-08-12 DIAGNOSIS — E042 Nontoxic multinodular goiter: Secondary | ICD-10-CM

## 2010-08-12 LAB — T4, FREE: Free T4: 0.88 ng/dL (ref 0.60–1.60)

## 2010-08-12 LAB — T3, FREE: T3, Free: 2.4 pg/mL (ref 2.3–4.2)

## 2010-08-12 MED ORDER — TEMAZEPAM 7.5 MG PO CAPS: 7.5000 mg | ORAL_CAPSULE | Freq: Every evening | ORAL | Status: DC | PRN

## 2010-08-12 NOTE — Progress Notes (Signed)
  Subjective:    Patient ID: Sherri Charles, female    DOB: December 18, 1965, 45 y.o.   MRN: [id number]  HPI Here to discuss the recent discovery of thyroid nodules. She had a cpx with me last July, and we found no abnormalities of her thyroid at that time. Her weight then was 107 lbs. Then she saw Dr. Chevis Pretty several weeks ago for her GYN exam, and he found that she had an enlarged thyroid. She had an [place] on 08-06-10 which showed both lobes to be a bit enlarged, with 3 distinct nodules in the left lobe (the largest of which was 10x11x12 mm) and a complex of nodules on the right measuring 5x13x15 mm. Dr. Chevis Pretty then asked her to see me. She does admit to more fatigue in the past 6 months than usual and more trouble sleeping. She has lost about 6 lbs since since last July, although she has been trying to eat a healthier diet. No sweats or shakiness or diarrhea or palpitations.    Review of Systems  Constitutional: Positive for fatigue and unexpected weight change.  HENT: Negative.   Eyes: Negative.   Respiratory: Negative.   Cardiovascular: Negative.   Gastrointestinal: Negative.        Objective:   Physical Exam  Constitutional: She appears well-developed and well-nourished.  Neck:       Her thyroid is indeed mildly enlarged in both lobes. A nodule can be felt in the right lobe near the isthmus. There is no tenderness   Cardiovascular: Normal rate, regular rhythm, normal heart sounds and intact distal pulses.   Pulmonary/Chest: Effort normal and breath sounds normal.  Lymphadenopathy:    She has no cervical adenopathy.          Assessment & Plan:  This is most likely a multinodular goiter, which may be toxic. I explained that the majority of these are benign, but the possibility of neoplasm most be considered. We will get a full thyroid panel today and refer her to Endocrine for further workup.

## 2010-08-14 NOTE — Progress Notes (Signed)
Left message on machine for patient  With lab results

## 2010-08-14 NOTE — Op Note (Signed)
NAMESUNI, JARNAGIN                  ACCOUNT NO.:  [id number]   MEDICAL RECORD NO.:  [id number]          PATIENT TYPE:  AMB   LOCATION:  [facility]                         FACILITY:  [facility]   PHYSICIAN:  Ronald L. Earlene Plater, M.D.  DATE OF BIRTH:  June 18, 1965   DATE OF PROCEDURE:  06/24/2004  DATE OF DISCHARGE:                                 OPERATIVE REPORT   PREOPERATIVE DIAGNOSIS:  Urethritis with urethral stenosis.   OPERATION/PROCEDURE:  1.  Cystourethroscopy.  2.  Urethral dilatation to 32-French.   SURGEON:  Lucrezia Starch. Earlene Plater, M.D.   ANESTHESIA:  LMA.   ESTIMATED BLOOD LOSS:  Negligible.   TUBES:  None.   COMPLICATIONS:  None.   INDICATIONS FOR PROCEDURE:  Mrs. Grey is a lovely 45 year old white female  who presented with a two-year history of frequency of urination, especially  in the evening. She has nocturia x5-6, has significant decreased flow of  urine.  Also has irritable bowel syndrome and difficulty emptying her  bladder.  At the office, she was found to have a pinpoint urethra.  It could  not be catheterized and after understanding the risks, benefits and  alternatives, she elected to proceed with cystoscopy and urethral  dilatation.   DESCRIPTION OF PROCEDURE:  The patient was placed in the supine position  after proper LMA anesthesia.  She was placed in the dorsal lithotomy  position and prepped and draped in the with Betadine in the sterile fashion.  Urethra was noted to be essentially pinhole and was dilated with female  sounds to 32-French.  There was some slight oozing noted.  Cystourethroscopy  was then performed. The bladder was noted to be smooth-walled, effluxing  clear urine was noted in the normally placed ureteral orifices bilaterally.  Both the 12-degree and 70-degree lenses were utilized.  The urethra had some  circumferential inflammatory polyps consistent with the urethritis.  The  bladder was drained and the panendoscope was removed. The patient was  taken  to the recovery room stable.   PLAN:  She was given Uracid.  We will watch her.  Possibly antibiotic  therapy should it persist.  She will see [place] as needed.      RLD/MEDQ  D:  06/24/2004  T:  06/24/2004  Job:  [id number]   cc:   Leatha Gilding. Mezer, M.D.  [address]  [place]  [place] [address]  Fax: [phone]

## 2010-08-19 ENCOUNTER — Encounter: Payer: Self-pay | Admitting: *Deleted

## 2010-08-21 ENCOUNTER — Ambulatory Visit (INDEPENDENT_AMBULATORY_CARE_PROVIDER_SITE_OTHER): Payer: BC Managed Care – PPO | Admitting: Endocrinology

## 2010-08-21 ENCOUNTER — Encounter: Payer: Self-pay | Admitting: Endocrinology

## 2010-08-21 VITALS — BP 120/68 | HR 112 | Temp 98.7°F | Ht 60.0 in | Wt 100.6 lb

## 2010-08-21 DIAGNOSIS — E042 Nontoxic multinodular goiter: Secondary | ICD-10-CM

## 2010-08-21 NOTE — Progress Notes (Signed)
Subjective:    Patient ID: Sherri Charles, female    DOB: 27-Aug-1965, 45 y.o.   MRN: [id number]  HPI Pt was at her annual appt with dr Chevis Pretty, when she was noted to have a goiter.  She reports 1 week of slight fullness at the anterior neck, and assoc slight discomfort. Past Medical History  Diagnosis Date  . GERD (gastroesophageal reflux disease)   . Overactive bladder     sees Dr. Earlene Plater  . IBS (irritable bowel syndrome)   . Infertility management     sees Dr. Teodora Medici  . Dry eyes     sees Dr. Marti Sleigh  . Osteopenia     per DEXA 09-28-07    Past Surgical History  Procedure Date  . Laparoscopy     x 2  . Cervix surgery     lesion removed from cervix  . Hernia repair     right inguinal   . Urethral dilation     x two, (last 5-10)  . Esophagogastroduodenoscopy 2006    per Dr. Russella Dar, esophagitis  . Colonoscopy 06-26-09    per Dr. Chestine Spore, repeat in 10 yrs  . Abdominal hysterectomy 2009    TAH and left SO per Dr. Randell Patient    History   Social History  . Marital Status: Married    Spouse Name: N/A    Number of Children: N/A  . Years of Education: N/A   Occupational History  . Designer, fashion/clothing   Social History Main Topics  . Smoking status: Never Smoker   . Smokeless tobacco: Not on file  . Alcohol Use: Yes     occasionally  . Drug Use:   . Sexually Active:    Other Topics Concern  . Not on file   Social History Narrative   Adopted a child from [place] 2008    Current Outpatient Prescriptions on File Prior to Visit  Medication Sig Dispense Refill  . Calcium Carbonate-Vitamin D (CALCIUM 600 + D PO) Take 1 tablet by mouth 2 (two) times daily.       . fish oil-omega-3 fatty acids 1000 MG capsule 4000mg  daily      . multivitamin (THERAGRAN) per tablet Take 1 tablet by mouth daily.        Marland Kitchen nystatin-triamcinolone (MYCOLOG II) cream as needed.       . temazepam (RESTORIL) 7.5 MG capsule Take 1 capsule (7.5 mg total) by mouth at bedtime as needed for sleep.  30 capsule  2     Allergies  Allergen Reactions  . Sulfonamide Derivatives     REACTION: rash    Family History  Problem Relation Age of Onset  . Alcohol abuse Father   . Arthritis Father   . Heart disease Father   . Hypertension Father   . Cancer Other     Ovarian Cancer, Breast Cancer (Other relative)  father had thyroidect at age 75 (benign)  BP 120/68  Pulse 112  Temp(Src) 98.7 F (37.1 C) (Oral)  Ht 5' (1.524 m)  Wt 100 lb 9.6 oz (45.632 kg)  BMI 19.65 kg/m2  SpO2 97%    Review of Systems She has a slight cough, headache, freq bm's, easy bruising, and insomnia.  She has lost 6 lbs, since 2011.  denies hoarseness, double vision, palpitations, sob, myalgias, excessive diaphoresis, numbness, tremor, anxiety, hypoglycemia, and rhinorrhea. She attributes urinary frequency to oab.    Objective:   Physical Exam VS: see vs page GEN: no distress HEAD: head: no  deformity eyes: no periorbital swelling, no proptosis external nose and ears are normal mouth: no abnormality is seen NECK: supple, thyroid is slight enlarged, if at all.  The surface is slightly irregular, but i do not appreciate any discrete nodule.   CHEST WALL: no deformity. CV: tachycardic rate, but reg rhythm, no murmur. MUSCULOSKELETAL: muscle bulk and strength are grossly normal.  no obvious joint swelling.  gait is normal and steady EXTEMITIES: no deformity.  no ulcer on the feet.  feet are of normal color and temp.  no edema PULSES: dorsalis pedis intact bilat.   NEURO:  cn 2-12 grossly intact.   readily moves all 4's.  sensation is intact to touch on the feet.  No tremor. SKIN:  Normal texture and temperature.  No rash or suspicious lesion is visible.   NODES:  None palpable at the neck PSYCH: alert, oriented x3.  Does not appear anxious nor depressed.      Lab Results  Component Value Date   TSH 1.05 08/12/2010   THYROID ULTRASOUND Rght thyroid lobe:  9 x 14 x 38 mm   Left thyroid lobe:  11 x 14 x 38 mm    Isthmus:  2 mm in total thickness    Focal nodules:  5 x 13 x 15 mm complex, junction isthmus and right   lobe   4 x 5 x 8 mm hypoechoic, superior left   10 x 11 x 12 mm complex, inferior left   3 x 5 x 8 mm complex, left isthmus    Lymphadenopathy:  None visualized.    IMPRESSION: Four small thyroid nodules  Assessment & Plan:  Very small multinodular goiter, new Sensation of fullness at the anterior neck, not thyroid-related Headache, and other thyroid sxs, not thyroid-related.

## 2010-08-21 NOTE — Patient Instructions (Signed)
You should have another thyroid ultrasound in 6-12 months.   return here as needed.

## 2010-08-24 DIAGNOSIS — E042 Nontoxic multinodular goiter: Secondary | ICD-10-CM | POA: Insufficient documentation

## 2010-10-16 ENCOUNTER — Other Ambulatory Visit (INDEPENDENT_AMBULATORY_CARE_PROVIDER_SITE_OTHER): Payer: BC Managed Care – PPO

## 2010-10-16 DIAGNOSIS — Z0000 Encounter for general adult medical examination without abnormal findings: Secondary | ICD-10-CM

## 2010-10-16 LAB — CBC WITH DIFFERENTIAL/PLATELET
Basophils Absolute: 0 10*3/uL (ref 0.0–0.1)
Lymphocytes Relative: 30.8 % (ref 12.0–46.0)
Lymphs Abs: 1.3 10*3/uL (ref 0.7–4.0)
Monocytes Relative: 6 % (ref 3.0–12.0)
Neutrophils Relative %: 61.5 % (ref 43.0–77.0)
Platelets: 257 10*3/uL (ref 150.0–400.0)
RDW: 14.2 % (ref 11.5–14.6)
WBC: 4.1 10*3/uL — ABNORMAL LOW (ref 4.5–10.5)

## 2010-10-16 LAB — LIPID PANEL
Cholesterol: 195 mg/dL (ref 0–200)
LDL Cholesterol: 79 mg/dL (ref 0–99)
Total CHOL/HDL Ratio: 2
VLDL: 8.6 mg/dL (ref 0.0–40.0)

## 2010-10-16 LAB — HEPATIC FUNCTION PANEL
AST: 22 U/L (ref 0–37)
Alkaline Phosphatase: 63 U/L (ref 39–117)
Bilirubin, Direct: 0 mg/dL (ref 0.0–0.3)
Total Bilirubin: 0.6 mg/dL (ref 0.3–1.2)

## 2010-10-16 LAB — BASIC METABOLIC PANEL
BUN: 16 mg/dL (ref 6–23)
Calcium: 10 mg/dL (ref 8.4–10.5)
Creatinine, Ser: 0.9 mg/dL (ref 0.4–1.2)
GFR: 75.59 mL/min (ref >60.00)
Glucose, Bld: 90 mg/dL (ref 70–99)

## 2010-10-16 LAB — POCT URINALYSIS DIPSTICK
Protein, UA: NEGATIVE
Spec Grav, UA: 1.02
Urobilinogen, UA: 0.2

## 2010-10-16 LAB — TSH: TSH: 0.64 u[IU]/mL (ref 0.35–5.50)

## 2010-10-22 ENCOUNTER — Telehealth: Payer: Self-pay | Admitting: Family Medicine

## 2010-10-22 NOTE — Telephone Encounter (Signed)
I spoke with pt  

## 2010-10-22 NOTE — Telephone Encounter (Signed)
Message copied by Baldemar Friday on Thu Oct 22, 2010  2:16 PM ------      Message from: Gershon Crane A      Created: Tue Oct 20, 2010  8:41 AM       normal

## 2010-10-26 ENCOUNTER — Ambulatory Visit (INDEPENDENT_AMBULATORY_CARE_PROVIDER_SITE_OTHER): Payer: BC Managed Care – PPO | Admitting: Family Medicine

## 2010-10-26 ENCOUNTER — Encounter: Payer: Self-pay | Admitting: Family Medicine

## 2010-10-26 VITALS — BP 108/70 | HR 85 | Temp 98.8°F | Ht 59.75 in | Wt 101.0 lb

## 2010-10-26 DIAGNOSIS — Z0000 Encounter for general adult medical examination without abnormal findings: Secondary | ICD-10-CM

## 2010-10-26 NOTE — Progress Notes (Signed)
  Subjective:    Patient ID: Sherri Charles, female    DOB: Nov 22, 1965, 45 y.o.   MRN: [id number]  HPI 45 yr old female for a cpx. She feels well and has no concerns. Her recent labs looked good.    Review of Systems  Constitutional: Negative.   HENT: Negative.   Eyes: Negative.   Respiratory: Negative.   Cardiovascular: Negative.   Gastrointestinal: Negative.   Genitourinary: Negative for dysuria, urgency, frequency, hematuria, flank pain, decreased urine volume, enuresis, difficulty urinating, pelvic pain and dyspareunia.  Musculoskeletal: Negative.   Skin: Negative.   Neurological: Negative.   Hematological: Negative.   Psychiatric/Behavioral: Negative.        Objective:   Physical Exam  Constitutional: She is oriented to person, place, and time. She appears well-developed and well-nourished. No distress.  HENT:  Head: Normocephalic and atraumatic.  Right Ear: External ear normal.  Left Ear: External ear normal.  Nose: Nose normal.  Mouth/Throat: Oropharynx is clear and moist. No oropharyngeal exudate.  Eyes: Conjunctivae and EOM are normal. Pupils are equal, round, and reactive to light. No scleral icterus.  Neck: Normal range of motion. Neck supple. No JVD present. No thyromegaly present.  Cardiovascular: Normal rate, regular rhythm, normal heart sounds and intact distal pulses.  Exam reveals no gallop and no friction rub.   No murmur heard. Pulmonary/Chest: Effort normal and breath sounds normal. No respiratory distress. She has no wheezes. She has no rales. She exhibits no tenderness.  Abdominal: Soft. Bowel sounds are normal. She exhibits no distension and no mass. There is no tenderness. There is no rebound and no guarding.  Musculoskeletal: Normal range of motion. She exhibits no edema and no tenderness.  Lymphadenopathy:    She has no cervical adenopathy.  Neurological: She is alert and oriented to person, place, and time. She has normal reflexes. No cranial nerve  deficit. She exhibits normal muscle tone. Coordination normal.  Skin: Skin is warm and dry. No rash noted. No erythema.  Psychiatric: She has a normal mood and affect. Her behavior is normal. Judgment and thought content normal.          Assessment & Plan:   Normal exam.

## 2011-02-25 ENCOUNTER — Other Ambulatory Visit: Payer: Self-pay | Admitting: Gynecology

## 2011-02-25 DIAGNOSIS — N63 Unspecified lump in unspecified breast: Secondary | ICD-10-CM

## 2011-03-02 ENCOUNTER — Ambulatory Visit
Admission: RE | Admit: 2011-03-02 | Discharge: 2011-03-02 | Disposition: A | Discharge status: Home / Self Care | Payer: BC Managed Care – PPO | Source: Ambulatory Visit | Attending: Gynecology | Admitting: Gynecology

## 2011-03-02 DIAGNOSIS — N63 Unspecified lump in unspecified breast: Secondary | ICD-10-CM

## 2011-03-11 ENCOUNTER — Other Ambulatory Visit: Payer: BC Managed Care – PPO

## 2011-04-01 ENCOUNTER — Telehealth: Payer: Self-pay | Admitting: Family Medicine

## 2011-04-01 MED ORDER — TEMAZEPAM 7.5 MG PO CAPS: 7.5000 mg | ORAL_CAPSULE | Freq: Every evening | ORAL | Status: DC | PRN

## 2011-04-01 NOTE — Telephone Encounter (Signed)
Rx called in to pharmacy. 

## 2011-04-01 NOTE — Telephone Encounter (Signed)
Call in a 6 month supply  

## 2011-04-01 NOTE — Telephone Encounter (Signed)
Refill request for Temazepam 7.5 mg take 1 po qhs prn and pt last here on 10/26/10.

## 2011-07-07 ENCOUNTER — Other Ambulatory Visit (HOSPITAL_COMMUNITY): Payer: Self-pay | Admitting: Gynecology

## 2011-07-07 DIAGNOSIS — Z1231 Encounter for screening mammogram for malignant neoplasm of breast: Secondary | ICD-10-CM

## 2011-07-27 ENCOUNTER — Other Ambulatory Visit: Payer: Self-pay | Admitting: Gynecology

## 2011-07-27 DIAGNOSIS — Z1231 Encounter for screening mammogram for malignant neoplasm of breast: Secondary | ICD-10-CM

## 2011-08-11 ENCOUNTER — Ambulatory Visit (HOSPITAL_COMMUNITY): Payer: BC Managed Care – PPO

## 2011-09-07 ENCOUNTER — Ambulatory Visit
Admission: RE | Admit: 2011-09-07 | Discharge: 2011-09-07 | Disposition: A | Discharge status: Home / Self Care | Payer: BC Managed Care – PPO | Source: Ambulatory Visit | Attending: Gynecology | Admitting: Gynecology

## 2011-09-07 DIAGNOSIS — Z1231 Encounter for screening mammogram for malignant neoplasm of breast: Secondary | ICD-10-CM

## 2011-10-13 ENCOUNTER — Telehealth: Payer: Self-pay | Admitting: Gastroenterology

## 2011-10-13 NOTE — Telephone Encounter (Signed)
Patient c/o rectal bleeding.  She reports it happens on and off, but this am had 2 episodes with a large amount of bright red blood.  She is going out of town on Friday.  She is offered an appt for today with Willette Cluster RNP, but she is unable to come due to a prior commitment.  She will come in and see Mike Gip PA tomorrow at 2:30

## 2011-10-14 ENCOUNTER — Ambulatory Visit (INDEPENDENT_AMBULATORY_CARE_PROVIDER_SITE_OTHER): Payer: BC Managed Care – PPO | Admitting: Physician Assistant

## 2011-10-14 ENCOUNTER — Encounter: Payer: Self-pay | Admitting: Physician Assistant

## 2011-10-14 VITALS — BP 100/54 | HR 96 | Ht 59.75 in | Wt 105.0 lb

## 2011-10-14 DIAGNOSIS — Z86010 Personal history of colonic polyps: Secondary | ICD-10-CM | POA: Insufficient documentation

## 2011-10-14 DIAGNOSIS — K625 Hemorrhage of anus and rectum: Secondary | ICD-10-CM

## 2011-10-14 MED ORDER — HYDROCORTISONE ACETATE 25 MG RE SUPP: RECTAL | Status: DC

## 2011-10-14 NOTE — Progress Notes (Signed)
Subjective:    Patient ID: Sherri Charles, female    DOB: 1966/02/19, 46 y.o.   MRN: [id number]  HPI  Sherri Charles is a pleasant 46 year old white female known to Dr. Russella Dar from prior colonoscopies. Her last colonoscopy was done in March of 2011 this was completely normal including retroflexed views of the rectum.  She comes in today with complaints of rectal bleeding. She says she had 2 bowel movements yesterday morning both mixed with bright red blood and what she describes as a fairly large amount. She says she had a total of 5 bowel movements yesterday but only a scant amount of blood by the last bowel movement. She had not had any sure a name or hard stool, has no complaints of cramping pain or rectal discomfort. She says she has had a very similar episode within the past couple of months but cleared within a day. Interestingly she says 2 nights before this episode she had some sharp abdominal pain and cramping in the middle of the night but only lasted for 15 or 20 minutes and then resolved, that following morning she did not have any blood in her stools. She says she feels fine, and had 2 normal bowel movements today with no blood. She has not been on any over-the-counter cold medicines, Sudafed etc., no long distance running etc.    Review of Systems  Constitutional: Negative.   HENT: Negative.   Eyes: Negative.   Respiratory: Negative.   Cardiovascular: Negative.   Gastrointestinal: Negative.   Genitourinary: Negative.   Musculoskeletal: Negative.   Neurological: Negative.   Hematological: Negative.   Psychiatric/Behavioral: Negative.    Outpatient Encounter Prescriptions as of 10/14/2011  Medication Sig Dispense Refill  . Calcium Carbonate-Vitamin D (CALCIUM 600 + D PO) Take 1 tablet by mouth 2 (two) times daily.       . fish oil-omega-3 fatty acids 1000 MG capsule 4000mg  daily      . multivitamin (THERAGRAN) per tablet Take 1 tablet by mouth daily.        Marland Kitchen nystatin-triamcinolone  (MYCOLOG II) cream as needed.       . temazepam (RESTORIL) 7.5 MG capsule Take 1 capsule (7.5 mg total) by mouth at bedtime as needed.  30 capsule  5  . hydrocortisone (ANUSOL-HC) 25 MG suppository Use 1 suppository twice daily for 7 days, then use 1 suppository at bedtime for 7 nights.  21 suppository  1  . DISCONTD: temazepam (RESTORIL) 7.5 MG capsule Take 1 capsule (7.5 mg total) by mouth at bedtime as needed for sleep.  30 capsule  2       Allergies  Allergen Reactions  . Sulfonamide Derivatives     REACTION: rash  . Amoxicillin Rash   Patient Active Problem List  Diagnosis  . ALLERGIC CONJUNCTIVITIS  . GERD  . BACK PAIN, LUMBAR  . GREATER TROCHANTERIC BURSITIS  . OSTEOPENIA  . Nontoxic multinodular goiter  . History of colonic polyps   History   Social History  . Marital Status: Married    Spouse Name: N/A    Number of Children: 1 A  . Years of Education: N/A   Occupational History  . Designer, fashion/clothing   Social History Main Topics  . Smoking status: Never Smoker   . Smokeless tobacco: Never Used  . Alcohol Use: Yes     occasionally  . Drug Use: No  . Sexually Active: Not on file   Other Topics Concern  . Not on file  Social History Narrative   Adopted a child from [place] 2008    Objective:   Physical Exam  well-developed white female in no acute distress, pleasant blood pressure 100/54 pulse 96, height 4 foot 11 weight 105. HEENT; nontraumatic normocephalic EOMI PERRLA sclera anicteric, Neck supple no JVD, Cardiovascular; regular rate and rhythm with S1-S2 no murmur or gallop, Pulmonary; clear bilaterally, Abdomen soft nontender nondistended no palpable mass or hepatosplenomegaly, bowel sounds are active. Rectal ;exam there is no external lesions or fissure noted, on anoscopy she has an area of inflammation and punctate areas of hemorrhage, this may be an inflamed internal hemorrhoid though looks atypical, this is soft non-palpable on digital exam., Extremities;  no clubbing cyanosis or edema and warm dry, Psych; mood and affect normal and appropriate        Assessment & Plan:   #63 46 year old female with painless hematochezia, with bright red blood mixed with her bowel movements yesterday him a normal bowel movements today. Patient had no associated abdominal pain cramping rectal discomfort rectal pain etc. Very similar episode x1 within the past couple of months. Anoscopy shows an area of inflammation rectum , question proctitis. Versus atypical appearing internal hemorrhoid #2 previous history of colon polyps, normal colonoscopy 3/ 2011, average risk Plan; for now will use Anusol-HC suppositories twice daily x7 days then at bedtime x7 days Schedule for flexible sigmoidoscopy with Dr. Russella Dar, procedure discussed in detail with the patient and she is agreeable to proceed

## 2011-10-14 NOTE — Patient Instructions (Addendum)
We sent a prescription to [facility] in [place], [place]. For Anusol HC Suppositories. Flexible Sigmoidoscopy has been scheduled with Dr. Claudette Head for 11-05-2011. Directions provided.  Flexible Sigmoidoscopy Your caregiver has ordered a flexible sigmoidoscopy. This is an exam to evaluate your lower colon. In this exam your colon is cleansed and a short fiber optic tube is inserted through your rectum and into your colon. The fiber optic scope (endoscope) is a short bundle of enclosed flexible small glass fibers. It transmits light to the area examined and images from that area to your caregiver. You do not have to worry about glass breakage in the endoscope. Discomfort is usually minimal. Sedatives and pain medications are generally not required. This exam helps to detect tumors (lumps), polyps, inflammation (swelling and soreness), and areas of bleeding. It may also be used to take biopsies. These are small pieces of tissue taken to examine under a microscope. LET YOUR CAREGIVER KNOW ABOUT:  Allergies.   Medications taken including herbs, eye drops, over the counter medications, and creams.   Use of steroids (by mouth or creams).   Previous problems with anesthetics or novocaine   Possibility of pregnancy, if this applies.   History of blood clots (thrombophlebitis).   History of bleeding or blood problems.   Previous surgery.   Other health problems.  BEFORE THE PROCEDURE Eat normally the night before the exam. Your caregiver may order a mild enema or laxative the night before. No eating or drinking should occur after midnight until the procedure is completed. A rectal suppository or enemas may be given in the morning prior to your procedure. You will be brought to the examination area in a hospital gown. You should be present 60 minutes prior to your procedure or as directed.  AFTER THE PROCEDURE   There is sometimes a little blood passed with the first bowel movement. Do not  be concerned. Because air is often used during the exam, it is not unusual to pass gas and experience abdominal (belly) cramping. Walking or a warm pack on your abdomen may help with this. Do not sleep with a heating pad as burns can occur.   You may resume all normal eating and activities.   Only take over-the-counter or prescription medicines for pain, discomfort, or fever as directed by your caregiver. Do not use aspirin or blood thinners if a biopsy (tissue sample) was taken. Consult your caregiver for medication usage if biopsies were taken.   Call for your results as instructed by your caregiver. Remember, it is your responsibility to obtain the results of your biopsy. Do not assume everything is fine because you do not hear from your caregiver.  SEEK IMMEDIATE MEDICAL CARE IF:  An oral temperature above 102 F (38.9 C) develops.   You pass large blood clots or fill a toilet with blood following the procedure. This may also occur 10 to 14 days following the procedure. It is more likely if a biopsy was taken.   You develop abdominal pain not relieved with medication or that is getting worse rather than better.  Document Released: 03/12/2000 Document Revised: 03/04/2011 Document Reviewed: 12/23/2004 [facility] Patient Information 2012 [place], [place].

## 2011-10-15 ENCOUNTER — Other Ambulatory Visit (INDEPENDENT_AMBULATORY_CARE_PROVIDER_SITE_OTHER): Payer: BC Managed Care – PPO

## 2011-10-15 ENCOUNTER — Encounter: Payer: Self-pay | Admitting: Gastroenterology

## 2011-10-15 DIAGNOSIS — Z0000 Encounter for general adult medical examination without abnormal findings: Secondary | ICD-10-CM

## 2011-10-15 LAB — CBC WITH DIFFERENTIAL/PLATELET
Basophils Relative: 0.6 % (ref 0.0–3.0)
Eosinophils Relative: 0.9 % (ref 0.0–5.0)
HCT: 37.9 % (ref 36.0–46.0)
Hemoglobin: 12.6 g/dL (ref 12.0–15.0)
Lymphs Abs: 1.3 10*3/uL (ref 0.7–4.0)
MCV: 92.7 fl (ref 78.0–100.0)
Monocytes Absolute: 0.3 10*3/uL (ref 0.1–1.0)
RBC: 4.09 Mil/uL (ref 3.87–5.11)
WBC: 5.5 10*3/uL (ref 4.5–10.5)

## 2011-10-15 LAB — POCT URINALYSIS DIPSTICK
Bilirubin, UA: NEGATIVE
Blood, UA: NEGATIVE
Ketones, UA: 1
Spec Grav, UA: 1.025
pH, UA: 5.5

## 2011-10-15 LAB — LIPID PANEL
Cholesterol: 216 mg/dL — ABNORMAL HIGH (ref 0–200)
Triglycerides: 28 mg/dL (ref 0.0–149.0)

## 2011-10-15 LAB — HEPATIC FUNCTION PANEL
ALT: 19 U/L (ref 0–35)
Bilirubin, Direct: 0.1 mg/dL (ref 0.0–0.3)
Total Protein: 7.2 g/dL (ref 6.0–8.3)

## 2011-10-15 LAB — BASIC METABOLIC PANEL
Chloride: 106 mEq/L (ref 96–112)
Potassium: 4.5 mEq/L (ref 3.5–5.1)

## 2011-10-15 NOTE — Progress Notes (Signed)
Agree with Ms. Esterwood's assessment and plan. Avalie Oconnor E. Calden Dorsey, MD, FACG   

## 2011-10-18 NOTE — Progress Notes (Signed)
Quick Note:  I left voice message with normal results. ______ 

## 2011-10-26 ENCOUNTER — Telehealth: Payer: Self-pay | Admitting: Physician Assistant

## 2011-10-26 NOTE — Telephone Encounter (Signed)
Patient given recommendation. 

## 2011-10-26 NOTE — Telephone Encounter (Signed)
Spoke with patient and she had to reschedule her flex sig to 12/08/11. She states she is not having any rectal bleeding currently. She is using suppositories nightly. She wants to know if she should continue the suppositories until procedure or have a refill in case she has bleeding again. Please, advise.

## 2011-10-26 NOTE — Telephone Encounter (Signed)
She may use the supp as needed until; her procedure

## 2011-10-27 ENCOUNTER — Encounter: Payer: Self-pay | Admitting: Family Medicine

## 2011-10-27 ENCOUNTER — Ambulatory Visit (INDEPENDENT_AMBULATORY_CARE_PROVIDER_SITE_OTHER): Payer: BC Managed Care – PPO | Admitting: Family Medicine

## 2011-10-27 VITALS — BP 104/68 | HR 81 | Temp 98.7°F | Ht 60.0 in | Wt 107.0 lb

## 2011-10-27 DIAGNOSIS — Z0000 Encounter for general adult medical examination without abnormal findings: Secondary | ICD-10-CM

## 2011-10-28 ENCOUNTER — Encounter: Payer: Self-pay | Admitting: Family Medicine

## 2011-10-28 NOTE — Progress Notes (Signed)
  Subjective:    Patient ID: Sherri Charles, female    DOB: Jul 01, 1965, 46 y.o.   MRN: [id number]  HPI 46 yr old female for a cpx. She is doing well in general. She did have some rectal bleeding a few weeks ago, and she saw GI for this. Anoscopy in the office revealed a possible proctitis, and she was given some Anusol HC suppositories. The bleeding has stopped, and she is scheduled for a flexible sigmoidoscopy on 12-08-11. She exercises regularly.    Review of Systems  Constitutional: Negative.   HENT: Negative.   Eyes: Negative.   Respiratory: Negative.   Cardiovascular: Negative.   Gastrointestinal: Negative.   Genitourinary: Negative for dysuria, urgency, frequency, hematuria, flank pain, decreased urine volume, enuresis, difficulty urinating, pelvic pain and dyspareunia.  Musculoskeletal: Negative.   Skin: Negative.   Neurological: Negative.   Hematological: Negative.   Psychiatric/Behavioral: Negative.        Objective:   Physical Exam  Constitutional: She is oriented to person, place, and time. She appears well-developed and well-nourished. No distress.  HENT:  Head: Normocephalic and atraumatic.  Right Ear: External ear normal.  Left Ear: External ear normal.  Nose: Nose normal.  Mouth/Throat: Oropharynx is clear and moist. No oropharyngeal exudate.  Eyes: Conjunctivae and EOM are normal. Pupils are equal, round, and reactive to light. No scleral icterus.  Neck: Normal range of motion. Neck supple. No JVD present. No thyromegaly present.  Cardiovascular: Normal rate, regular rhythm, normal heart sounds and intact distal pulses.  Exam reveals no gallop and no friction rub.   No murmur heard. Pulmonary/Chest: Effort normal and breath sounds normal. No respiratory distress. She has no wheezes. She has no rales. She exhibits no tenderness.  Abdominal: Soft. Bowel sounds are normal. She exhibits no distension and no mass. There is no tenderness. There is no rebound and no  guarding.  Musculoskeletal: Normal range of motion. She exhibits no edema and no tenderness.  Lymphadenopathy:    She has no cervical adenopathy.  Neurological: She is alert and oriented to person, place, and time. She has normal reflexes. No cranial nerve deficit. She exhibits normal muscle tone. Coordination normal.  Skin: Skin is warm and dry. No rash noted. No erythema.  Psychiatric: She has a normal mood and affect. Her behavior is normal. Judgment and thought content normal.          Assessment & Plan:  Well exam. She is doing well.

## 2011-11-05 ENCOUNTER — Other Ambulatory Visit: Payer: BC Managed Care – PPO | Admitting: Gastroenterology

## 2011-12-07 ENCOUNTER — Telehealth: Payer: Self-pay | Admitting: Gastroenterology

## 2011-12-07 NOTE — Telephone Encounter (Signed)
All questions answered

## 2011-12-08 ENCOUNTER — Ambulatory Visit (AMBULATORY_SURGERY_CENTER): Payer: BC Managed Care – PPO | Admitting: Gastroenterology

## 2011-12-08 ENCOUNTER — Encounter: Payer: Self-pay | Admitting: Gastroenterology

## 2011-12-08 VITALS — BP 121/75 | HR 90 | Temp 98.6°F | Resp 20 | Ht 59.0 in | Wt 105.0 lb

## 2011-12-08 DIAGNOSIS — K625 Hemorrhage of anus and rectum: Secondary | ICD-10-CM

## 2011-12-08 DIAGNOSIS — K921 Melena: Secondary | ICD-10-CM

## 2011-12-08 MED ORDER — SODIUM CHLORIDE 0.9 % IV SOLN: 500.0000 mL | INTRAVENOUS | Status: DC

## 2011-12-08 NOTE — Op Note (Signed)
[facility] [address] [place], [address]   FLEXIBLE SIGMOIDOSCOPY PROCEDURE REPORT  PATIENT: Sherri Charles, Sherri Charles  MR#: [id number] BIRTHDATE: 12/20/1965 , 45  yrs. old GENDER: Female ENDOSCOPIST: Meryl Dare, MD, [facility]  PROCEDURE DATE:  12/08/2011 PROCEDURE:   Sigmoidoscopy, diagnostic ASA CLASS:   Class II INDICATIONS:hematochezia,  rectal bleeding. MEDICATIONS: Fentanyl 75 mcg IV and Versed 5 mg IV DESCRIPTION OF PROCEDURE:   After the risks benefits and alternatives of the procedure were thoroughly explained, informed consent was obtained.  revealed no abnormalities of the rectum. The [id number]-[id number]  endoscope was introduced through the anus and advanced to the descending colon , limited by No adverse events experienced.   The quality of the prep was excellent .  The instrument was then slowly withdrawn as the mucosa was fully examined.    COLON FINDINGS: The colonic mucosa appeared normal in the descending colon, sigmoid colon, and rectum.  Retroflexed views revealed moderate internal hemorrhoids.  The scope was then withdrawn from the patient and the procedure terminated.  COMPLICATIONS: There were no complications.  ENDOSCOPIC IMPRESSION: 1 . The mucosa appeared normal in the descending colon, sigmoid colon, and rectum 2.  Moderate internal hemorrhoids  RECOMMENDATIONS: 1.  Hemorrhoid supp daily as needed   Recall Date & Procedure]   eSigned:  Meryl Dare, MD, [facility] 12/08/2011 8:56 AM

## 2011-12-08 NOTE — Progress Notes (Signed)
Patient did not experience any of the following events: a burn prior to discharge; a fall within the facility; wrong site/side/patient/procedure/implant event; or a hospital transfer or hospital admission upon discharge from the facility. (G8907) Patient did not have preoperative order for IV antibiotic SSI prophylaxis. (G8918)  

## 2011-12-08 NOTE — Patient Instructions (Signed)
YOU HAD AN ENDOSCOPIC PROCEDURE TODAY AT THE [facility]: Refer to the procedure report that was given to you for any specific questions about what was found during the examination.  If the procedure report does not answer your questions, please call your gastroenterologist to clarify.  If you requested that your care partner not be given the details of your procedure findings, then the procedure report has been included in a sealed envelope for you to review at your convenience later.  YOU SHOULD EXPECT: Some feelings of bloating in the abdomen. Passage of more gas than usual.  Walking can help get rid of the air that was put into your GI tract during the procedure and reduce the bloating. If you had a lower endoscopy (such as a colonoscopy or flexible sigmoidoscopy) you may notice spotting of blood in your stool or on the toilet paper. If you underwent a bowel prep for your procedure, then you may not have a normal bowel movement for a few days.  DIET: Your first meal following the procedure should be a light meal and then it is ok to progress to your normal diet.  A half-sandwich or bowl of soup is an example of a good first meal.  Heavy or fried foods are harder to digest and may make you feel nauseous or bloated.  Likewise meals heavy in dairy and vegetables can cause extra gas to form and this can also increase the bloating.  Drink plenty of fluids but you should avoid alcoholic beverages for 24 hours.  ACTIVITY: Your care partner should take you home directly after the procedure.  You should plan to take it easy, moving slowly for the rest of the day.  You can resume normal activity the day after the procedure however you should NOT DRIVE or use heavy machinery for 24 hours (because of the sedation medicines used during the test).    SYMPTOMS TO REPORT IMMEDIATELY: A gastroenterologist can be reached at any hour.  During normal business hours, 8:30 AM to 5:00 PM Monday through Friday,  call [phone].  After hours and on weekends, please call the GI answering service at [phone] who will take a message and have the physician on call contact you.   Following lower endoscopy (colonoscopy or flexible sigmoidoscopy):  Excessive amounts of blood in the stool  Significant tenderness or worsening of abdominal pains  Swelling of the abdomen that is new, acute  Fever of 100F or higher    FOLLOW UP: If any biopsies were taken you will be contacted by phone or by letter within the next 1-3 weeks.  Call your gastroenterologist if you have not heard about the biopsies in 3 weeks.  Our staff will call the home number listed on your records the next business day following your procedure to check on you and address any questions or concerns that you may have at that time regarding the information given to you following your procedure. This is a courtesy call and so if there is no answer at the home number and we have not heard from you through the emergency physician on call, we will assume that you have returned to your regular daily activities without incident.  SIGNATURES/CONFIDENTIALITY: You and/or your care partner have signed paperwork which will be entered into your electronic medical record.  These signatures attest to the fact that that the information above on your After Visit Summary has been reviewed and is understood.  Full responsibility of the confidentiality   of this discharge information lies with you and/or your care-partner.     

## 2011-12-09 ENCOUNTER — Telehealth: Payer: Self-pay | Admitting: *Deleted

## 2011-12-09 NOTE — Telephone Encounter (Signed)
  Follow up Call-  Call back number 12/08/2011  Post procedure Call Back phone  # [phone]  Permission to leave phone message Yes     Patient questions:  Do you have a fever, pain , or abdominal swelling? no Pain Score  0 *  Have you tolerated food without any problems? yes  Have you been able to return to your normal activities? yes  Do you have any questions about your discharge instructions: Diet   no Medications  no Follow up visit  no  Do you have questions or concerns about your Care? no  Actions: * If pain score is 4 or above: No action needed, pain <4.

## 2011-12-15 ENCOUNTER — Telehealth: Payer: Self-pay | Admitting: Family Medicine

## 2011-12-15 MED ORDER — TEMAZEPAM 7.5 MG PO CAPS: 7.5000 mg | ORAL_CAPSULE | Freq: Every evening | ORAL | Status: DC | PRN

## 2011-12-15 NOTE — Telephone Encounter (Signed)
I called in script 

## 2011-12-15 NOTE — Telephone Encounter (Signed)
Refill request for Temazepam 7.5 mg take 1 po qhs prn and last here on 10/27/11.

## 2011-12-15 NOTE — Telephone Encounter (Signed)
Call in #[phone] with 5 rf 

## 2012-05-01 ENCOUNTER — Telehealth: Payer: Self-pay | Admitting: Family Medicine

## 2012-05-01 DIAGNOSIS — E042 Nontoxic multinodular goiter: Secondary | ICD-10-CM

## 2012-05-01 NOTE — Telephone Encounter (Signed)
Pt left voice message, she would like a referral to see Dr. Talmage Nap, she went to see another doctor for thyroid but did not like that person at all. She has a lump on her throat.

## 2012-05-02 NOTE — Telephone Encounter (Signed)
I spoke with pt  

## 2012-05-02 NOTE — Telephone Encounter (Signed)
Referral was done  

## 2012-05-03 ENCOUNTER — Other Ambulatory Visit (HOSPITAL_COMMUNITY): Payer: Self-pay | Admitting: Family Medicine

## 2012-05-03 DIAGNOSIS — E042 Nontoxic multinodular goiter: Secondary | ICD-10-CM

## 2012-05-04 ENCOUNTER — Ambulatory Visit
Admission: RE | Admit: 2012-05-04 | Discharge: 2012-05-04 | Disposition: A | Discharge status: Home / Self Care | Payer: BC Managed Care – PPO | Source: Ambulatory Visit | Attending: Family Medicine | Admitting: Family Medicine

## 2012-05-04 DIAGNOSIS — E042 Nontoxic multinodular goiter: Secondary | ICD-10-CM

## 2012-06-06 ENCOUNTER — Other Ambulatory Visit: Payer: Self-pay | Admitting: Endocrinology

## 2012-06-06 DIAGNOSIS — E042 Nontoxic multinodular goiter: Secondary | ICD-10-CM

## 2012-06-08 ENCOUNTER — Ambulatory Visit
Admission: RE | Admit: 2012-06-08 | Discharge: 2012-06-08 | Disposition: A | Discharge status: Home / Self Care | Payer: BC Managed Care – PPO | Source: Ambulatory Visit | Attending: Endocrinology | Admitting: Endocrinology

## 2012-06-08 ENCOUNTER — Other Ambulatory Visit (HOSPITAL_COMMUNITY)
Admission: RE | Admit: 2012-06-08 | Discharge: 2012-06-08 | Disposition: A | Discharge status: Home / Self Care | Payer: BC Managed Care – PPO | Source: Ambulatory Visit | Attending: Interventional Radiology | Admitting: Interventional Radiology

## 2012-06-08 DIAGNOSIS — E042 Nontoxic multinodular goiter: Secondary | ICD-10-CM

## 2012-06-08 DIAGNOSIS — E041 Nontoxic single thyroid nodule: Secondary | ICD-10-CM | POA: Insufficient documentation

## 2012-08-11 ENCOUNTER — Other Ambulatory Visit: Payer: Self-pay

## 2012-08-11 DIAGNOSIS — Z1231 Encounter for screening mammogram for malignant neoplasm of breast: Secondary | ICD-10-CM

## 2012-09-15 ENCOUNTER — Ambulatory Visit
Admission: RE | Admit: 2012-09-15 | Discharge: 2012-09-15 | Disposition: A | Discharge status: Home / Self Care | Payer: BC Managed Care – PPO | Source: Ambulatory Visit

## 2012-09-15 DIAGNOSIS — Z1231 Encounter for screening mammogram for malignant neoplasm of breast: Secondary | ICD-10-CM

## 2012-10-20 ENCOUNTER — Other Ambulatory Visit (INDEPENDENT_AMBULATORY_CARE_PROVIDER_SITE_OTHER): Payer: BC Managed Care – PPO

## 2012-10-20 DIAGNOSIS — Z0000 Encounter for general adult medical examination without abnormal findings: Secondary | ICD-10-CM

## 2012-10-20 DIAGNOSIS — R7989 Other specified abnormal findings of blood chemistry: Secondary | ICD-10-CM

## 2012-10-20 LAB — POCT URINALYSIS DIPSTICK
Bilirubin, UA: NEGATIVE
Glucose, UA: NEGATIVE
Ketones, UA: NEGATIVE
Leukocytes, UA: NEGATIVE
Nitrite, UA: NEGATIVE
pH, UA: 7

## 2012-10-20 LAB — CBC WITH DIFFERENTIAL/PLATELET
Basophils Absolute: 0 10*3/uL (ref 0.0–0.1)
Eosinophils Relative: 1.4 % (ref 0.0–5.0)
MCV: 93.1 fl (ref 78.0–100.0)
Monocytes Absolute: 0.2 10*3/uL (ref 0.1–1.0)
Neutrophils Relative %: 69.1 % (ref 43.0–77.0)
Platelets: 220 10*3/uL (ref 150.0–400.0)
RDW: 14 % (ref 11.5–14.6)
WBC: 5 10*3/uL (ref 4.5–10.5)

## 2012-10-20 LAB — HEPATIC FUNCTION PANEL
ALT: 19 U/L (ref 0–35)
Bilirubin, Direct: 0.1 mg/dL (ref 0.0–0.3)
Total Bilirubin: 0.6 mg/dL (ref 0.3–1.2)

## 2012-10-20 LAB — LIPID PANEL
Cholesterol: 210 mg/dL — ABNORMAL HIGH (ref 0–200)
HDL: 98.2 mg/dL (ref >39.00)
Triglycerides: 43 mg/dL (ref 0.0–149.0)
VLDL: 8.6 mg/dL (ref 0.0–40.0)

## 2012-10-20 LAB — BASIC METABOLIC PANEL
BUN: 12 mg/dL (ref 6–23)
Creatinine, Ser: 0.7 mg/dL (ref 0.4–1.2)
GFR: 93.49 mL/min (ref >60.00)

## 2012-10-20 NOTE — Progress Notes (Signed)
Quick Note:  Pt has appointment on 10/27/12 will go over then. ______

## 2012-10-27 ENCOUNTER — Ambulatory Visit (INDEPENDENT_AMBULATORY_CARE_PROVIDER_SITE_OTHER): Payer: BC Managed Care – PPO | Admitting: Family Medicine

## 2012-10-27 ENCOUNTER — Encounter: Payer: Self-pay | Admitting: Family Medicine

## 2012-10-27 VITALS — BP 128/70 | HR 86 | Temp 98.2°F | Ht 60.0 in | Wt 107.0 lb

## 2012-10-27 DIAGNOSIS — Z0000 Encounter for general adult medical examination without abnormal findings: Secondary | ICD-10-CM

## 2012-10-27 NOTE — Progress Notes (Signed)
  Subjective:    Patient ID: Sherri Charles, female    DOB: [birth year], 47 y.o.   MRN: [id number]  HPI 47 yr old female for a cpx. She is doing well in general although she mentions some mild sx lately of feeling run down or tired. She recently started working out at [employer] again. She eats well and takes a multivitamin daily. She had a workup in March per Dr. Talmage Nap for mild thyromegaly and some thyroid nodules, and all biopsies were benign.    Review of Systems  Constitutional: Negative.   HENT: Negative.   Eyes: Negative.   Respiratory: Negative.   Cardiovascular: Negative.   Gastrointestinal: Negative.   Genitourinary: Negative for dysuria, urgency, frequency, hematuria, flank pain, decreased urine volume, enuresis, difficulty urinating, pelvic pain and dyspareunia.  Musculoskeletal: Negative.   Skin: Negative.   Neurological: Negative.   Psychiatric/Behavioral: Negative.        Objective:   Physical Exam  Constitutional: She is oriented to person, place, and time. She appears well-developed and well-nourished. No distress.  HENT:  Head: Normocephalic and atraumatic.  Right Ear: External ear normal.  Left Ear: External ear normal.  Nose: Nose normal.  Mouth/Throat: Oropharynx is clear and moist. No oropharyngeal exudate.  Eyes: Conjunctivae and EOM are normal. Pupils are equal, round, and reactive to light. No scleral icterus.  Neck: Normal range of motion. Neck supple. No JVD present. No thyromegaly present.  Cardiovascular: Normal rate, regular rhythm, normal heart sounds and intact distal pulses.  Exam reveals no gallop and no friction rub.   No murmur heard. Pulmonary/Chest: Effort normal and breath sounds normal. No respiratory distress. She has no wheezes. She has no rales. She exhibits no tenderness.  Abdominal: Soft. Bowel sounds are normal. She exhibits no distension and no mass. There is no tenderness. There is no rebound and no guarding.  Musculoskeletal: Normal range  of motion. She exhibits no edema and no tenderness.  Lymphadenopathy:    She has no cervical adenopathy.  Neurological: She is alert and oriented to person, place, and time. She has normal reflexes. No cranial nerve deficit. She exhibits normal muscle tone. Coordination normal.  Skin: Skin is warm and dry. No rash noted. No erythema.  Psychiatric: She has a normal mood and affect. Her behavior is normal. Judgment and thought content normal.          Assessment & Plan:  Well exam. Recheck prn

## 2013-06-16 IMAGING — MG MM DIGITAL DIAGNOSTIC UNILAT*R*
3 series · 3 of 3 positions shown · non-contrast
Comparison: 08/10/2010 and prior mammograms dating back to
05/20/2006

CLINICAL DATA: 45-year-old female with palpable lump in the upper
right breast discovered on self examination.

DIGITAL DIAGNOSTIC RIGHT MAMMOGRAM WITH CAD AND RIGHT BREAST
ULTRASOUND:

[R CC]
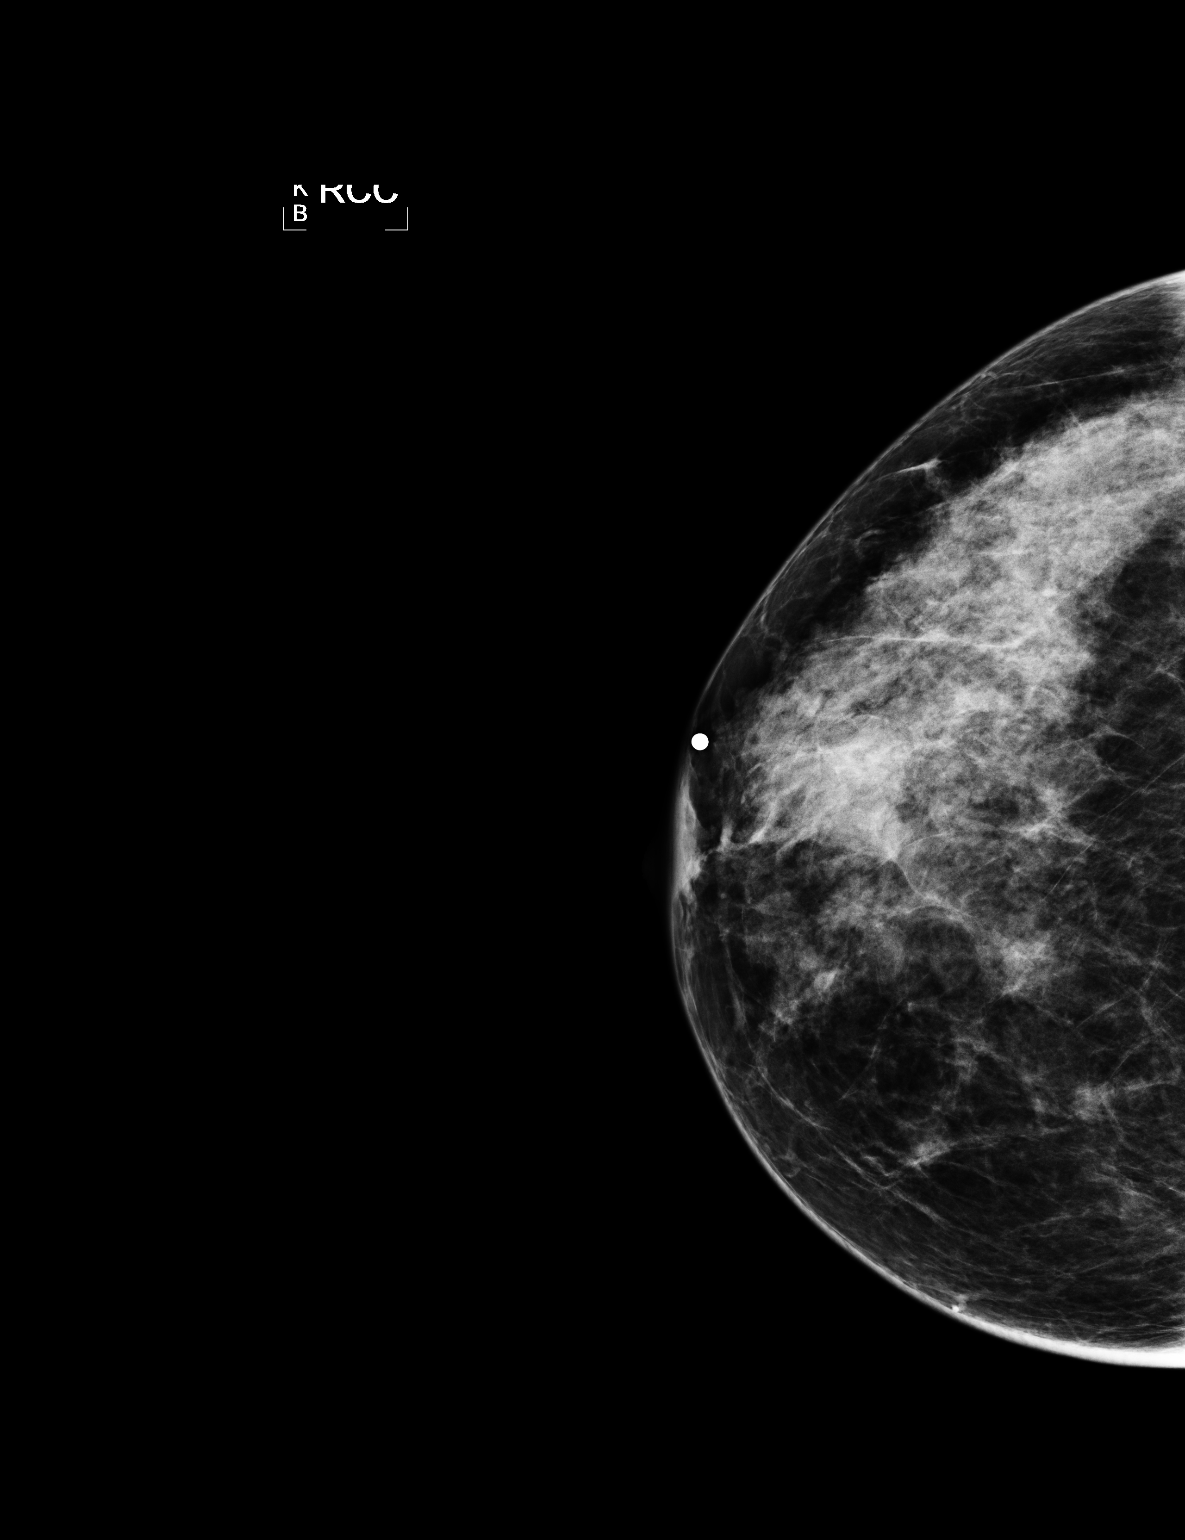

[R MLO]
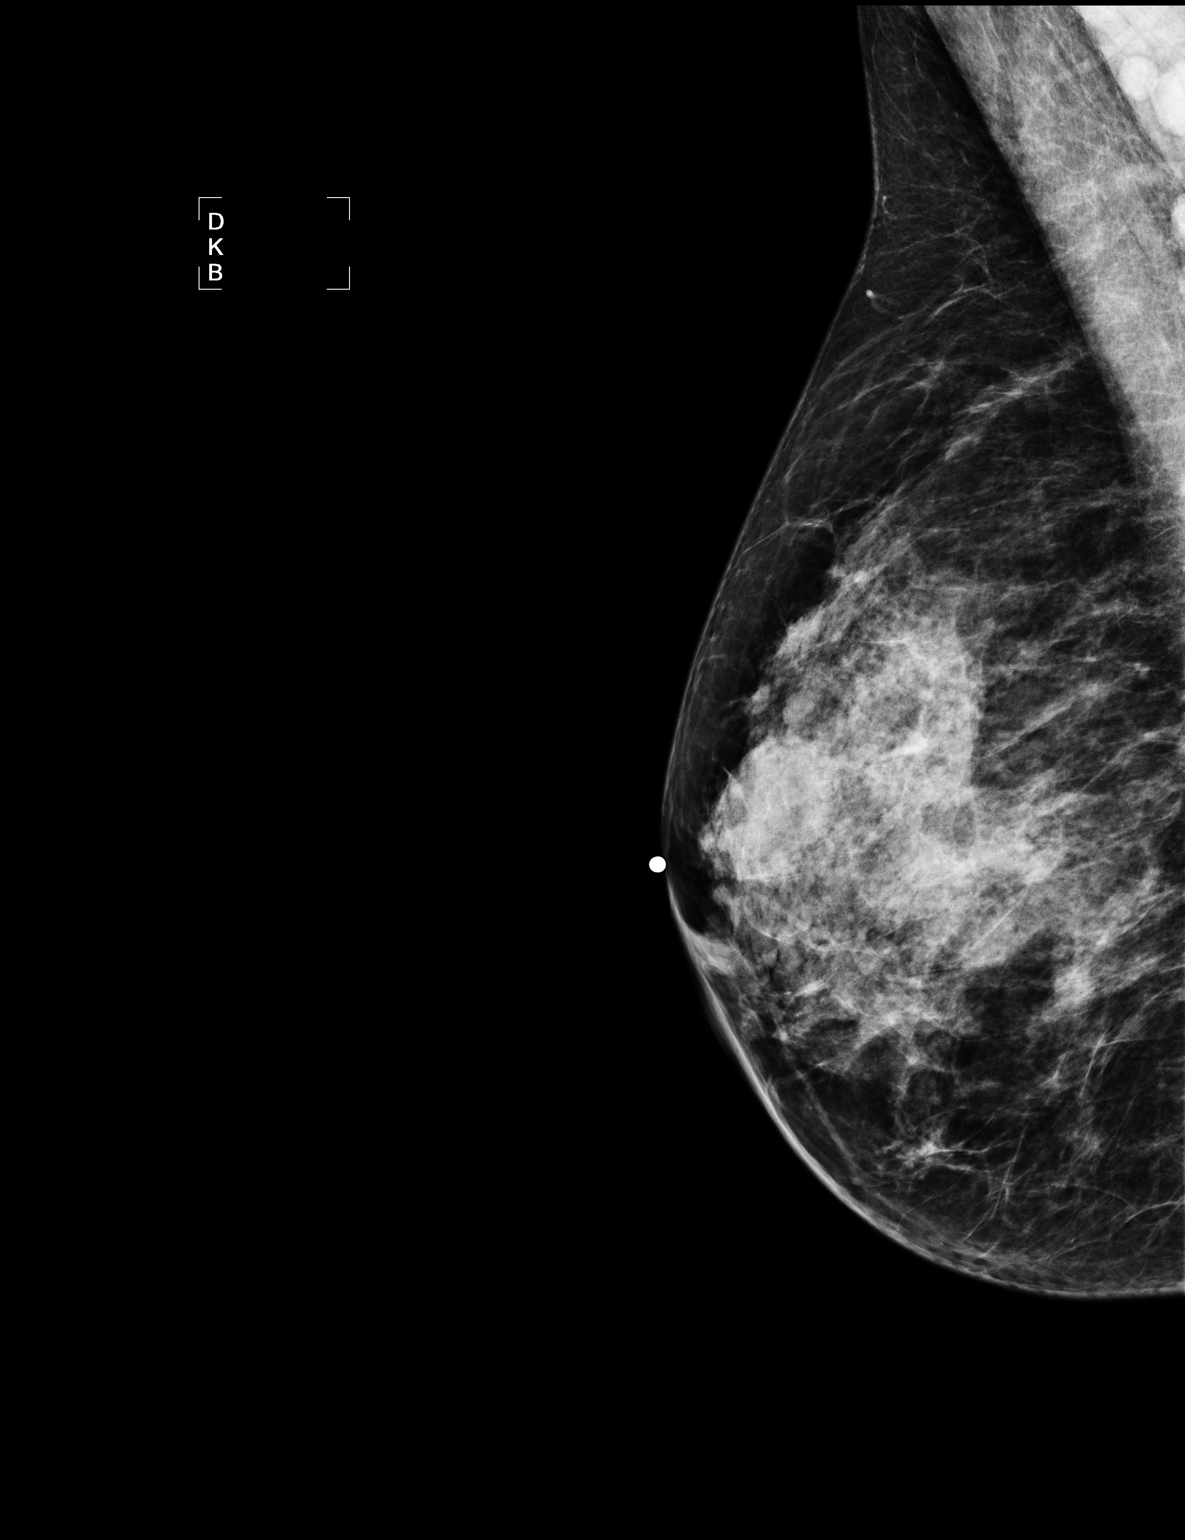

[R TAN]
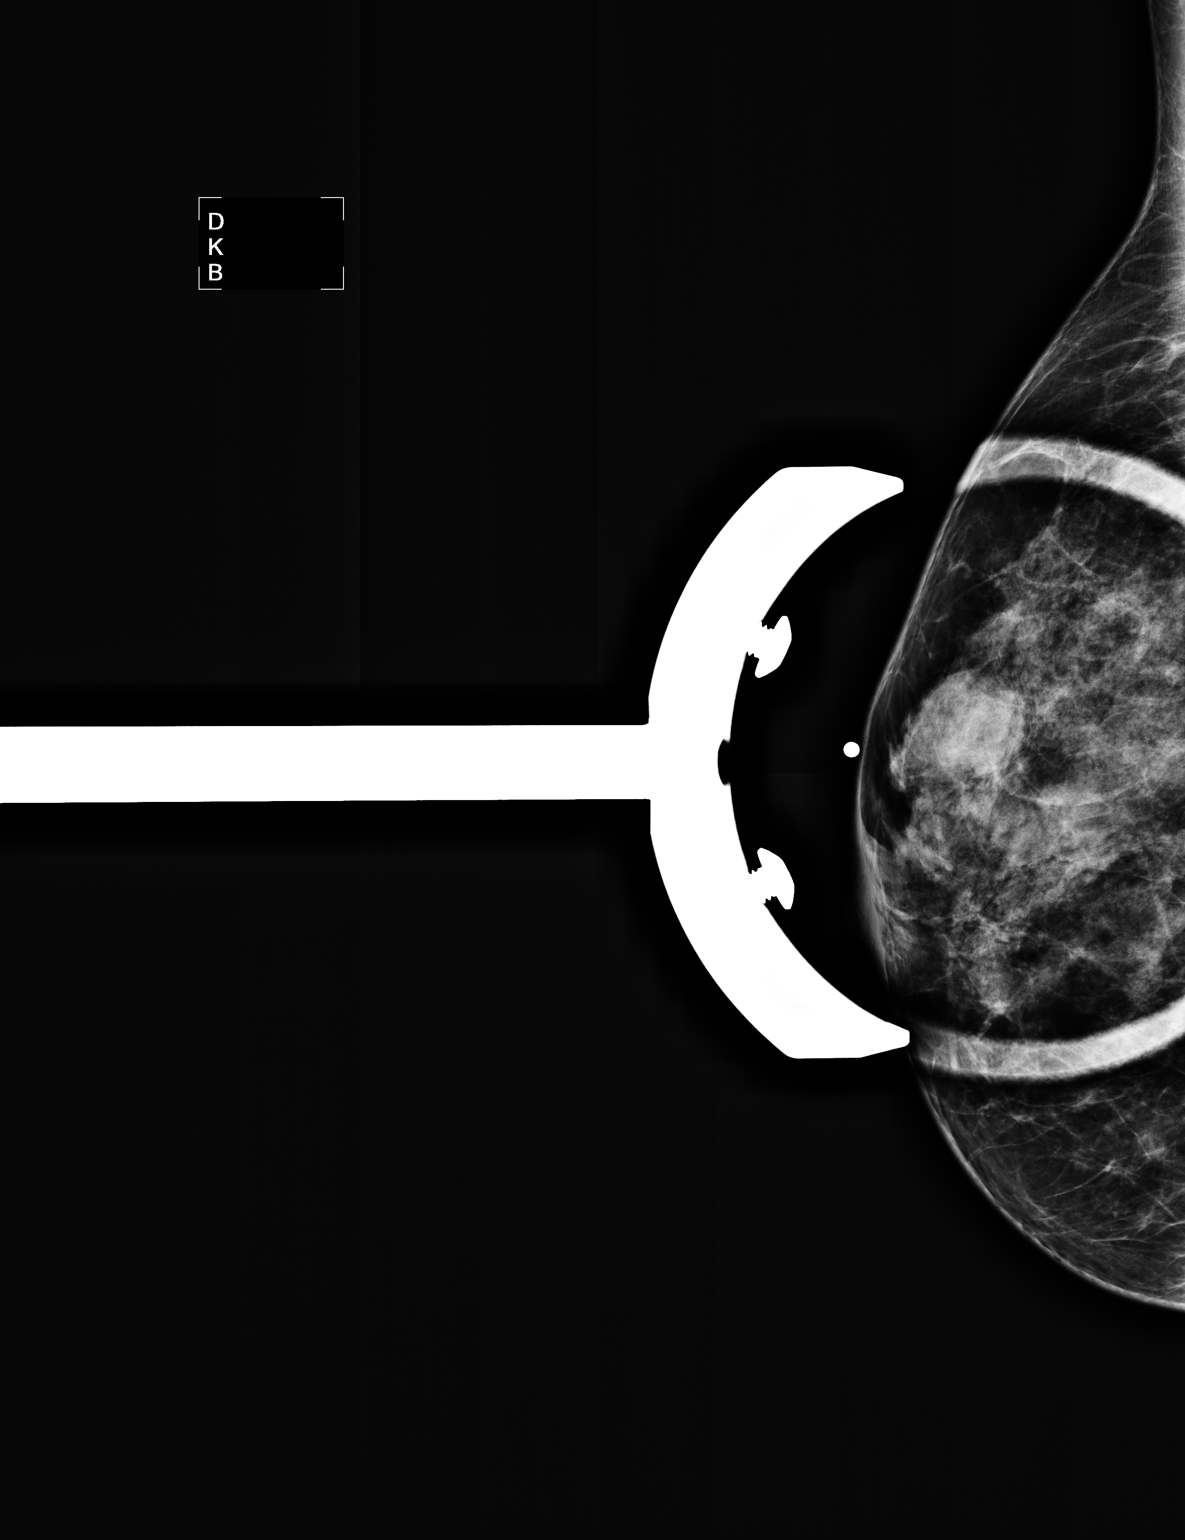

[3 of 3 positions shown; findings below may reference images not displayed]

FINDINGS: CC, MLO and spot compression views of the right breast
again demonstrate heterogeneously dense breast tissue.
A circumscribed oval mass in the upper right breast is present.
There is no evidence of distortion or suspicious calcifications.
Mammographic images were processed with CAD.

On physical exam, a firm palpable mobile mass is identified at the
11 o'clock position of the right breast 2 cm from the nipple.

Ultrasound is performed, showing a 1.1 x 2 x 1.4 cm simple cyst at
the 11 o'clock position of the right breast 2 cm from the nipple,
corresponding to the patient's palpable abnormality.
IMPRESSION: Simple cyst in the upper right breast corresponding to the
patient's palpable abnormality.

No other specific mammographic evidence of right breast malignancy.

These findings were discussed with the patient and her questions
answered.  She was encouraged to begin/continue monthly self exams
and to contact her primary physician if any changes noted.

BI-RADS CATEGORY 2:  Benign finding(s).

Recommend bilateral screening mammograms in 4 months to resume
annual schedule.

## 2013-06-21 ENCOUNTER — Other Ambulatory Visit: Payer: Self-pay

## 2013-06-21 DIAGNOSIS — Z1231 Encounter for screening mammogram for malignant neoplasm of breast: Secondary | ICD-10-CM

## 2013-09-25 ENCOUNTER — Ambulatory Visit
Admission: RE | Admit: 2013-09-25 | Discharge: 2013-09-25 | Disposition: A | Discharge status: Home / Self Care | Payer: BC Managed Care – PPO | Source: Ambulatory Visit

## 2013-09-25 DIAGNOSIS — Z1231 Encounter for screening mammogram for malignant neoplasm of breast: Secondary | ICD-10-CM

## 2013-10-16 ENCOUNTER — Ambulatory Visit (INDEPENDENT_AMBULATORY_CARE_PROVIDER_SITE_OTHER)
Admission: RE | Admit: 2013-10-16 | Discharge: 2013-10-16 | Disposition: A | Discharge status: Home / Self Care | Payer: BC Managed Care – PPO | Source: Ambulatory Visit | Attending: Family Medicine | Admitting: Family Medicine

## 2013-10-16 ENCOUNTER — Ambulatory Visit (INDEPENDENT_AMBULATORY_CARE_PROVIDER_SITE_OTHER): Payer: BC Managed Care – PPO | Admitting: Family Medicine

## 2013-10-16 ENCOUNTER — Encounter: Payer: Self-pay | Admitting: Family Medicine

## 2013-10-16 VITALS — BP 132/79 | HR 80 | Temp 98.8°F | Ht 60.0 in | Wt 108.0 lb

## 2013-10-16 DIAGNOSIS — S93609A Unspecified sprain of unspecified foot, initial encounter: Secondary | ICD-10-CM

## 2013-10-16 DIAGNOSIS — S92001A Unspecified fracture of right calcaneus, initial encounter for closed fracture: Secondary | ICD-10-CM

## 2013-10-16 DIAGNOSIS — S93601A Unspecified sprain of right foot, initial encounter: Secondary | ICD-10-CM

## 2013-10-16 DIAGNOSIS — S92009A Unspecified fracture of unspecified calcaneus, initial encounter for closed fracture: Secondary | ICD-10-CM

## 2013-10-16 NOTE — Progress Notes (Signed)
Pre visit review using our clinic review tool, if applicable. No additional management support is needed unless otherwise documented below in the visit note. 

## 2013-10-16 NOTE — Progress Notes (Signed)
   Subjective:    Patient ID: Sherri Charles, female    DOB: May 19, 1965, 48 y.o.   MRN: [id number]  HPI Here for an injury to the right foot which occurred on 10-13-13 while the family was on vacation in [place]. She fell down some steps and felt the foot twist. It swelled immediately and turned black and blue. Since then the swelling has gone down a bit. She has pain in the top of the foot toward the lateral side. Using Advil.    Review of Systems  Constitutional: Negative.   Musculoskeletal: Positive for arthralgias and joint swelling.       Objective:   Physical Exam  Constitutional:  Walks with a limp   Musculoskeletal:  She has swelling over the dorsal and lateral right foot with some ecchymosis. Not tender over the distal fibula. Full ROM of the ankle. Very tender over the 4th metatarsal head.           Assessment & Plan:  Possible metatarsal fracture. We will send her for Xrays this morning.

## 2013-10-16 NOTE — Addendum Note (Signed)
Addended by: Alysia Penna A on: 10/16/2013 05:22 PM   Modules accepted: Orders

## 2013-10-29 ENCOUNTER — Other Ambulatory Visit (INDEPENDENT_AMBULATORY_CARE_PROVIDER_SITE_OTHER): Payer: BC Managed Care – PPO

## 2013-10-29 DIAGNOSIS — Z0000 Encounter for general adult medical examination without abnormal findings: Secondary | ICD-10-CM

## 2013-10-29 LAB — BASIC METABOLIC PANEL
BUN: 15 mg/dL (ref 6–23)
CHLORIDE: 104 meq/L (ref 96–112)
CO2: 25 mEq/L (ref 19–32)
Calcium: 9.5 mg/dL (ref 8.4–10.5)
Creatinine, Ser: 0.8 mg/dL (ref 0.4–1.2)
GFR: 79.95 mL/min (ref >60.00)
Glucose, Bld: 83 mg/dL (ref 70–99)
POTASSIUM: 3.6 meq/L (ref 3.5–5.1)
SODIUM: 134 meq/L — AB (ref 135–145)

## 2013-10-29 LAB — CBC WITH DIFFERENTIAL/PLATELET
BASOS PCT: 0.6 % (ref 0.0–3.0)
Basophils Absolute: 0 10*3/uL (ref 0.0–0.1)
EOS PCT: 1.3 % (ref 0.0–5.0)
Eosinophils Absolute: 0.1 10*3/uL (ref 0.0–0.7)
HEMATOCRIT: 36.3 % (ref 36.0–46.0)
HEMOGLOBIN: 12.1 g/dL (ref 12.0–15.0)
LYMPHS ABS: 1.3 10*3/uL (ref 0.7–4.0)
Lymphocytes Relative: 29 % (ref 12.0–46.0)
MCHC: 33.4 g/dL (ref 30.0–36.0)
MCV: 92.1 fl (ref 78.0–100.0)
MONO ABS: 0.2 10*3/uL (ref 0.1–1.0)
Monocytes Relative: 5.5 % (ref 3.0–12.0)
NEUTROS PCT: 63.6 % (ref 43.0–77.0)
Neutro Abs: 2.8 10*3/uL (ref 1.4–7.7)
Platelets: 215 10*3/uL (ref 150.0–400.0)
RBC: 3.95 Mil/uL (ref 3.87–5.11)
RDW: 14 % (ref 11.5–15.5)
WBC: 4.4 10*3/uL (ref 4.0–10.5)

## 2013-10-29 LAB — LIPID PANEL
CHOL/HDL RATIO: 3
Cholesterol: 204 mg/dL — ABNORMAL HIGH (ref 0–200)
HDL: 77.3 mg/dL (ref >39.00)
LDL CALC: 118 mg/dL — AB (ref 0–99)
NonHDL: 126.7
TRIGLYCERIDES: 44 mg/dL (ref 0.0–149.0)
VLDL: 8.8 mg/dL (ref 0.0–40.0)

## 2013-10-29 LAB — POCT URINALYSIS DIPSTICK
Bilirubin, UA: NEGATIVE
Glucose, UA: NEGATIVE
Ketones, UA: NEGATIVE
Leukocytes, UA: NEGATIVE
Nitrite, UA: NEGATIVE
PROTEIN UA: NEGATIVE
RBC UA: NEGATIVE
SPEC GRAV UA: 1.015
UROBILINOGEN UA: 0.2
pH, UA: 5.5

## 2013-10-29 LAB — HEPATIC FUNCTION PANEL
ALBUMIN: 3.8 g/dL (ref 3.5–5.2)
ALT: 19 U/L (ref 0–35)
AST: 26 U/L (ref 0–37)
Alkaline Phosphatase: 69 U/L (ref 39–117)
Bilirubin, Direct: 0 mg/dL (ref 0.0–0.3)
Total Bilirubin: 0.5 mg/dL (ref 0.2–1.2)
Total Protein: 7 g/dL (ref 6.0–8.3)

## 2013-10-29 LAB — TSH: TSH: 1.35 u[IU]/mL (ref 0.35–4.50)

## 2013-11-05 ENCOUNTER — Ambulatory Visit (INDEPENDENT_AMBULATORY_CARE_PROVIDER_SITE_OTHER): Payer: BC Managed Care – PPO | Admitting: Family Medicine

## 2013-11-05 ENCOUNTER — Encounter: Payer: Self-pay | Admitting: Family Medicine

## 2013-11-05 VITALS — BP 124/75 | HR 69 | Ht 60.25 in | Wt 107.0 lb

## 2013-11-05 DIAGNOSIS — M949 Disorder of cartilage, unspecified: Secondary | ICD-10-CM

## 2013-11-05 DIAGNOSIS — M8580 Other specified disorders of bone density and structure, unspecified site: Secondary | ICD-10-CM

## 2013-11-05 DIAGNOSIS — Z0000 Encounter for general adult medical examination without abnormal findings: Secondary | ICD-10-CM

## 2013-11-05 DIAGNOSIS — M899 Disorder of bone, unspecified: Secondary | ICD-10-CM

## 2013-11-05 MED ORDER — TRAZODONE HCL 50 MG PO TABS: 50.0000 mg | ORAL_TABLET | Freq: Every day | ORAL | Status: DC

## 2013-11-05 NOTE — Progress Notes (Signed)
   Subjective:    Patient ID: Sherri Charles, female    DOB: 1965-12-05, 48 y.o.   MRN: [id number]  HPI 48 yr old female for a cpx. In general she feels well. She is recovering from a foot injury for which she saw Dr. Durward Fortes. He felt she did not have a fracture so he had her wear a brace for 3 weeks. She is out of this now and she is driving again. She tried temazepam for sleep and it worked well, however she felt hung over in the mornings.    Review of Systems  Constitutional: Negative.   HENT: Negative.   Eyes: Negative.   Respiratory: Negative.   Cardiovascular: Negative.   Gastrointestinal: Negative.   Genitourinary: Negative for dysuria, urgency, frequency, hematuria, flank pain, decreased urine volume, enuresis, difficulty urinating, pelvic pain and dyspareunia.  Musculoskeletal: Negative.   Skin: Negative.   Neurological: Negative.   Psychiatric/Behavioral: Negative.        Objective:   Physical Exam  Constitutional: She is oriented to person, place, and time. She appears well-developed and well-nourished. No distress.  HENT:  Head: Normocephalic and atraumatic.  Right Ear: External ear normal.  Left Ear: External ear normal.  Nose: Nose normal.  Mouth/Throat: Oropharynx is clear and moist. No oropharyngeal exudate.  Eyes: Conjunctivae and EOM are normal. Pupils are equal, round, and reactive to light. No scleral icterus.  Neck: Normal range of motion. Neck supple. No JVD present. No thyromegaly present.  Cardiovascular: Normal rate, regular rhythm, normal heart sounds and intact distal pulses.  Exam reveals no gallop and no friction rub.   No murmur heard. Pulmonary/Chest: Effort normal and breath sounds normal. No respiratory distress. She has no wheezes. She has no rales. She exhibits no tenderness.  Abdominal: Soft. Bowel sounds are normal. She exhibits no distension and no mass. There is no tenderness. There is no rebound and no guarding.  Musculoskeletal: Normal  range of motion. She exhibits no edema and no tenderness.  Lymphadenopathy:    She has no cervical adenopathy.  Neurological: She is alert and oriented to person, place, and time. She has normal reflexes. No cranial nerve deficit. She exhibits normal muscle tone. Coordination normal.  Skin: Skin is warm and dry. No rash noted. No erythema.  Psychiatric: She has a normal mood and affect. Her behavior is normal. Judgment and thought content normal.          Assessment & Plan:  Well exam. Try Trazodone 50 mg for sleep. Set up another DEXA.

## 2013-11-05 NOTE — Progress Notes (Signed)
Pre visit review using our clinic review tool, if applicable. No additional management support is needed unless otherwise documented below in the visit note. 

## 2013-11-19 ENCOUNTER — Telehealth: Payer: Self-pay | Admitting: Family Medicine

## 2013-11-19 DIAGNOSIS — M8580 Other specified disorders of bone density and structure, unspecified site: Secondary | ICD-10-CM

## 2013-11-19 NOTE — Telephone Encounter (Signed)
Pt left a voice message, she is still waiting on the bone density referral, also she has been taking the Trazodone 50 mg for sleep and she increased it to 100 mg because the 50 mg was not working. She said that the 100 mg works, however she feels sleepy in the mornings until she has taken a shower. If this is normal then she would like to change the dosage on the medication refill and continue with this?

## 2013-11-21 NOTE — Telephone Encounter (Signed)
Tell her to take 75 mg of Trazodone qhs (use 1 and 1/2 tabs of the 50 mg). I did out in the referral so please find out why she has not been contacted

## 2013-11-21 NOTE — Telephone Encounter (Signed)
I spoke with pt and updated medication list. 

## 2013-11-21 NOTE — Telephone Encounter (Signed)
Can you check into the referral and let pt know the status?

## 2013-11-28 ENCOUNTER — Ambulatory Visit (INDEPENDENT_AMBULATORY_CARE_PROVIDER_SITE_OTHER): Payer: BC Managed Care – PPO | Admitting: Physical Therapy

## 2013-11-28 DIAGNOSIS — M25519 Pain in unspecified shoulder: Secondary | ICD-10-CM

## 2013-11-28 DIAGNOSIS — M6281 Muscle weakness (generalized): Secondary | ICD-10-CM

## 2013-11-30 ENCOUNTER — Telehealth: Payer: Self-pay | Admitting: Family Medicine

## 2013-11-30 NOTE — Telephone Encounter (Signed)
Pt left a voice message and requested refill on Trazodone with new increased dose of 75 mg. Also she is waiting on a referral still for the bone density scan. Can we check into this?

## 2013-11-30 NOTE — Telephone Encounter (Signed)
I ordered a DEXA on 11-05-13 the day she was here. Please ask Hilda Blades what happened to this. Call in Trazodone 50 mg to take 1 and 1/2 pills daily for one year (they do not make a 75 mg tablet)

## 2013-12-04 MED ORDER — TRAZODONE HCL 50 MG PO TABS: 75.0000 mg | ORAL_TABLET | Freq: Every evening | ORAL | Status: DC | PRN

## 2013-12-04 NOTE — Telephone Encounter (Signed)
Can you check into the scan? Also I did send script e-scribe.

## 2013-12-04 NOTE — Addendum Note (Signed)
Addended by: Aggie Hacker A on: 12/04/2013 10:44 AM   Modules accepted: Orders

## 2013-12-04 NOTE — Telephone Encounter (Signed)
I FORWARD THIS TO THE SCHEDULERS TO SCHEDULE

## 2013-12-11 ENCOUNTER — Other Ambulatory Visit: Payer: BC Managed Care – PPO

## 2013-12-12 ENCOUNTER — Ambulatory Visit (INDEPENDENT_AMBULATORY_CARE_PROVIDER_SITE_OTHER)
Admission: RE | Admit: 2013-12-12 | Discharge: 2013-12-12 | Disposition: A | Discharge status: Home / Self Care | Payer: BC Managed Care – PPO | Source: Ambulatory Visit | Attending: Family Medicine | Admitting: Family Medicine

## 2013-12-12 DIAGNOSIS — M899 Disorder of bone, unspecified: Secondary | ICD-10-CM

## 2013-12-12 DIAGNOSIS — M949 Disorder of cartilage, unspecified: Secondary | ICD-10-CM

## 2013-12-12 DIAGNOSIS — M8580 Other specified disorders of bone density and structure, unspecified site: Secondary | ICD-10-CM

## 2014-04-04 ENCOUNTER — Ambulatory Visit (INDEPENDENT_AMBULATORY_CARE_PROVIDER_SITE_OTHER): Payer: BLUE CROSS/BLUE SHIELD | Admitting: Family Medicine

## 2014-04-04 ENCOUNTER — Telehealth: Payer: Self-pay | Admitting: Gastroenterology

## 2014-04-04 ENCOUNTER — Ambulatory Visit (INDEPENDENT_AMBULATORY_CARE_PROVIDER_SITE_OTHER): Payer: BLUE CROSS/BLUE SHIELD

## 2014-04-04 VITALS — BP 132/80 | HR 98 | Temp 98.0°F | Resp 17 | Ht 60.0 in | Wt 108.0 lb

## 2014-04-04 DIAGNOSIS — K219 Gastro-esophageal reflux disease without esophagitis: Secondary | ICD-10-CM

## 2014-04-04 DIAGNOSIS — R079 Chest pain, unspecified: Secondary | ICD-10-CM

## 2014-04-04 MED ORDER — RANITIDINE HCL 150 MG PO TABS: 150.0000 mg | ORAL_TABLET | Freq: Two times a day (BID) | ORAL | Status: DC

## 2014-04-04 MED ORDER — GI COCKTAIL ~~LOC~~
30.0000 mL | Freq: Once | ORAL | Status: AC
  Administered 2014-04-04: 30 mL via ORAL

## 2014-04-04 MED ORDER — SUCRALFATE 1 GM/10ML PO SUSP: 1.0000 g | Freq: Three times a day (TID) | ORAL | Status: DC

## 2014-04-04 NOTE — Progress Notes (Signed)
   Subjective:    Patient ID: Sherri Charles, female    DOB: 1965-04-23, 49 y.o.   MRN: [id number]  HPI Patient presents CP that started this morning; felt when she first woke up and feels like pressure in the center of her chest that radiates to her throat. Denies fever, SOB, cough, or edema. No h/o HTN or coronary dz, but has had acid reflux that was controlled by diet and this does not feel like burning. Admits to having [employer] last night for dinner which is not usual for her. Went to work and pressure increased. When at lunch felt like food bolus got stuck and was painful going down. Denies recent long distance travel or recent illness. Is not a smoker. Was given prilosec and maalox at work without improvement. Has appt with GI, Dr. Fuller Plan, 04/05/14 for reflux.  Review of Systems  Constitutional: Negative for fever, chills, diaphoresis, appetite change and fatigue.  Respiratory: Negative for cough, chest tightness, shortness of breath and wheezing.   Cardiovascular: Positive for chest pain. Negative for palpitations and leg swelling.  Gastrointestinal: Negative for nausea and abdominal pain.  Neurological: Negative for dizziness and headaches.       Objective:   Physical Exam  Constitutional: She is oriented to person, place, and time. She appears well-developed and well-nourished. No distress.  Blood pressure 132/80, pulse 98, temperature 98 F (36.7 C), temperature source Oral, resp. rate 17, height 5' (1.524 m), weight 108 lb (48.988 kg), SpO2 96 %.  HENT:  Head: Normocephalic and atraumatic.  Right Ear: External ear normal.  Left Ear: External ear normal.  Neck: Neck supple. No thyromegaly present.  Cardiovascular: Normal rate, regular rhythm, normal heart sounds and intact distal pulses.  Exam reveals no gallop and no friction rub.   No murmur heard. Pulmonary/Chest: Effort normal and breath sounds normal. No respiratory distress. She has no decreased breath sounds. She has no  wheezes. She has no rhonchi. She has no rales. She exhibits no tenderness.  Abdominal: Soft. Normal appearance and bowel sounds are normal. She exhibits no distension and no mass. There is no tenderness. There is no rebound and no guarding. No hernia.  Musculoskeletal: She exhibits no edema.  Lymphadenopathy:    She has no cervical adenopathy.  Neurological: She is alert and oriented to person, place, and time.  Skin: Skin is warm and dry. No rash noted. She is not diaphoretic. No erythema. No pallor.   EKG with Dr. Carlota Raspberry. No acute findings.   Chest pressure improved following GI cocktail.  [facility] reading (PRIMARY) by  Dr. Carlota Raspberry. No acute cardiopulmonary findings.     Assessment & Plan:  1. Chest pain, unspecified chest pain type 2. Gastroesophageal reflux disease, esophagitis presence not specified With improvement of pressure with GI cocktail and EKG and CXR without findings, CP not likely cardiac in origin and more likely GI. Has f/u already scheduled with GI 04/05/14. - EKG 12-Lead - gi cocktail (Maalox,Lidocaine,Donnatal); Take 30 mLs by mouth once. - DG Chest 2 View; Future - sucralfate (CARAFATE) 1 GM/10ML suspension; Take 10 mLs (1 g total) by mouth 4 (four) times daily -  with meals and at bedtime.  Dispense: 420 mL; Refill: 0 - ranitidine (ZANTAC) 150 MG tablet; Take 1 tablet (150 mg total) by mouth 2 (two) times daily.  Dispense: 60 tablet; Refill: 0   Dorotha Hirschi PA-C  [facility] 04/04/2014 6:11 PM

## 2014-04-04 NOTE — Telephone Encounter (Signed)
I spoke with the patient and the nurse from [facility]. Patient woke up with chest pressure and "heavyness" pain was a They gave her multiple doses of pepcid and mylanta with no relief in chest pain.  Her pain is worsened with eating.  She had left the clinic to go pick up a child.  She will come in tomorrow am and see Alonza Bogus, PA at 9:30.  I have encouraged her to go to the ER today for eval.  She has agreed. She will call if this appt is not needed in the am

## 2014-04-05 ENCOUNTER — Encounter: Payer: Self-pay | Admitting: Gastroenterology

## 2014-04-05 ENCOUNTER — Ambulatory Visit (INDEPENDENT_AMBULATORY_CARE_PROVIDER_SITE_OTHER): Payer: BLUE CROSS/BLUE SHIELD | Admitting: Gastroenterology

## 2014-04-05 VITALS — BP 100/60 | HR 88 | Ht 60.0 in | Wt 104.6 lb

## 2014-04-05 DIAGNOSIS — R079 Chest pain, unspecified: Secondary | ICD-10-CM

## 2014-04-05 DIAGNOSIS — R1314 Dysphagia, pharyngoesophageal phase: Secondary | ICD-10-CM

## 2014-04-05 DIAGNOSIS — K219 Gastro-esophageal reflux disease without esophagitis: Secondary | ICD-10-CM

## 2014-04-05 MED ORDER — PANTOPRAZOLE SODIUM 40 MG PO TBEC: 40.0000 mg | DELAYED_RELEASE_TABLET | Freq: Two times a day (BID) | ORAL | Status: DC

## 2014-04-05 NOTE — Progress Notes (Signed)
04/05/2014 Sherri Charles [id number] [birth year]   HISTORY OF PRESENT ILLNESS:  This is a pleasant 49 year old female who is previously known to Dr. Fuller Plan for colonoscopy in the past.  She is here today with her husband with complaints of chest pain and difficulty/painful swallowing.  She states that she woke up yesterday morning and was having discomfort in her chest, which she described as heaviness.  It got worse throughout the day. When she tried to eat lunch it hurt for her food to go down and it seemed to get hung up. She went to the [employer] at her job they gave her Maalox and Prilosec, which did not allow any relief of her symptoms. Last night and she went to an urgent care where they gave her a GI cocktail which seemed to help for a short time. EKGs and chest x-rays were done and were unremarkable. She was sent home with prescriptions for Zantac 150 mg twice daily and Carafate suspension 4 times daily. This appt was scheduled for her to follow up.  Symptoms kept her up all night despite a dose of Zantac and two doses of carafate.  She does have a history reflux esophagitis seen on EGD in January 2006; esophageal biopsies were benign. She has not been on any chronic medication for this and says that she had been doing well with diet alone.   Past Medical History  Diagnosis Date  . GERD (gastroesophageal reflux disease)   . Overactive bladder     sees Dr. Rosana Hoes  . IBS (irritable bowel syndrome)   . Infertility management     sees Dr. Delila Pereyra  . Dry eyes     sees Dr. Joya San  . Osteopenia     per DEXA 09-28-07  . Thyroid disease    Past Surgical History  Procedure Laterality Date  . Laparoscopy      x 2  . Cervix surgery      lesion removed from cervix  . Hernia repair      right inguinal   . Urethral dilation      x two, (last 5-10)  . Esophagogastroduodenoscopy  2006    per Dr. Fuller Plan, esophagitis  . Colonoscopy  06-26-09    per Dr. Carlis Abbott, repeat in 10 yrs  .  Abdominal hysterectomy  2009    TAH and left SO per Dr. Warnell Forester    reports that she has never smoked. She has never used smokeless tobacco. She reports that she drinks alcohol. She reports that she does not use illicit drugs. family history includes Alcohol abuse in her mother; Arthritis in her father; Breast cancer in her paternal aunt; Heart disease in her mother; Hypertension in her father; Ovarian cancer in her paternal aunt. Allergies  Allergen Reactions  . Sulfonamide Derivatives     REACTION: rash  . Amoxicillin Rash      Outpatient Encounter Prescriptions as of 04/05/2014  Medication Sig  . Calcium Carbonate-Vitamin D (CALCIUM 600 + D PO) Take 1 tablet by mouth 2 (two) times daily.   . fish oil-omega-3 fatty acids 1000 MG capsule 4000mg  daily  . multivitamin (THERAGRAN) per tablet Take 1 tablet by mouth daily.    Marland Kitchen nystatin cream (MYCOSTATIN) Apply 1 application topically 2 (two) times daily.  . pantoprazole (PROTONIX) 40 MG tablet Take 1 tablet (40 mg total) by mouth 2 (two) times daily.  . ranitidine (ZANTAC) 150 MG tablet Take 1 tablet (150 mg total) by mouth 2 (two)  times daily.  . sucralfate (CARAFATE) 1 GM/10ML suspension Take 10 mLs (1 g total) by mouth 4 (four) times daily -  with meals and at bedtime.  . traZODone (DESYREL) 50 MG tablet Take 1.5 tablets (75 mg total) by mouth at bedtime as needed for sleep. (Patient not taking: Reported on 04/04/2014)     REVIEW OF SYSTEMS  : All other systems reviewed and negative except where noted in the History of Present Illness.   PHYSICAL EXAM: BP 100/60 mmHg  Pulse 88  Ht 5' (1.524 m)  Wt 104 lb 9.6 oz (47.446 kg)  BMI 20.43 kg/m2 General: Well developed white female in no acute distress Head: Normocephalic and atraumatic Eyes:  Sclerae anicteric, conjunctiva pink. Ears: Normal auditory acuity Lungs: Clear throughout to auscultation Heart: Regular rate and rhythm Abdomen: Soft, non-distended.  Normal bowel sounds.  Mild  epigastric TTP without R/R/G. Musculoskeletal: Symmetrical with no gross deformities  Skin: No lesions on visible extremities Extremities: No edema  Neurological: Alert oriented x 4, grossly non-focal Psychological:  Alert and cooperative. Normal mood and affect  ASSESSMENT AND PLAN: -Atypical chest pain with dysphagia/odynophagia:  Patient has a history of reflux esophagitis and has not been on any treatment for that so this may very well be the issue again.  May also be having esophageal spasm that this.  Will start pantoprazole 40 mg BID and will take Zantac 150 mg at bedtime as well as carafate ACHS for now.  Will schedule EGD with Dr. Fuller Plan for further evaluation to rule out stricture, etc, however, I think that this is less likely with the sudden onset of her symptoms.  The risks, benefits, and alternatives were discussed with the patient and she consents to proceed.

## 2014-04-05 NOTE — Patient Instructions (Signed)
You have been scheduled for an endoscopy. Please follow written instructions given to you at your visit today. If you use inhalers (even only as needed), please bring them with you on the day of your procedure. Your physician has requested that you go to www.startemmi.com and enter the access code given to you at your visit today. This web site gives a general overview about your procedure. However, you should still follow specific instructions given to you by our office regarding your preparation for the procedure.  We have sent the following medications to your pharmacy for you to pick up at your convenience: Protonix 40 mg, please take one tablet twice daily   Please continue your Carafate and Zantac at bedtime. _____________________________________________________________________________________________________________________________________________________ Gastroesophageal Reflux Disease, Adult Gastroesophageal reflux disease (GERD) happens when acid from your stomach flows up into the esophagus. When acid comes in contact with the esophagus, the acid causes soreness (inflammation) in the esophagus. Over time, GERD may create small holes (ulcers) in the lining of the esophagus. CAUSES   Increased body weight. This puts pressure on the stomach, making acid rise from the stomach into the esophagus.  Smoking. This increases acid production in the stomach.  Drinking alcohol. This causes decreased pressure in the lower esophageal sphincter (valve or ring of muscle between the esophagus and stomach), allowing acid from the stomach into the esophagus.  Late evening meals and a full stomach. This increases pressure and acid production in the stomach.  A malformed lower esophageal sphincter. Sometimes, no cause is found. SYMPTOMS   Burning pain in the lower part of the mid-chest behind the breastbone and in the mid-stomach area. This may occur twice a week or more often.  Trouble  swallowing.  Sore throat.  Dry cough.  Asthma-like symptoms including chest tightness, shortness of breath, or wheezing. DIAGNOSIS  Your caregiver may be able to diagnose GERD based on your symptoms. In some cases, X-rays and other tests may be done to check for complications or to check the condition of your stomach and esophagus. TREATMENT  Your caregiver may recommend over-the-counter or prescription medicines to help decrease acid production. Ask your caregiver before starting or adding any new medicines.  HOME CARE INSTRUCTIONS   Change the factors that you can control. Ask your caregiver for guidance concerning weight loss, quitting smoking, and alcohol consumption.  Avoid foods and drinks that make your symptoms worse, such as:  Caffeine or alcoholic drinks.  Chocolate.  Peppermint or mint flavorings.  Garlic and onions.  Spicy foods.  Citrus fruits, such as oranges, lemons, or limes.  Tomato-based foods such as sauce, chili, salsa, and pizza.  Fried and fatty foods.  Avoid lying down for the 3 hours prior to your bedtime or prior to taking a nap.  Eat small, frequent meals instead of large meals.  Wear loose-fitting clothing. Do not wear anything tight around your waist that causes pressure on your stomach.  Raise the head of your bed 6 to 8 inches with wood blocks to help you sleep. Extra pillows will not help.  Only take over-the-counter or prescription medicines for pain, discomfort, or fever as directed by your caregiver.  Do not take aspirin, ibuprofen, or other nonsteroidal anti-inflammatory drugs (NSAIDs). SEEK IMMEDIATE MEDICAL CARE IF:   You have pain in your arms, neck, jaw, teeth, or back.  Your pain increases or changes in intensity or duration.  You develop nausea, vomiting, or sweating (diaphoresis).  You develop shortness of breath, or you faint.  Your  vomit is green, yellow, black, or looks like coffee grounds or blood.  Your stool is  red, bloody, or black. These symptoms could be signs of other problems, such as heart disease, gastric bleeding, or esophageal bleeding. MAKE SURE YOU:   Understand these instructions.  Will watch your condition.  Will get help right away if you are not doing well or get worse. Document Released: 12/23/2004 Document Revised: 06/07/2011 Document Reviewed: 10/02/2010 [facility] Patient Information 2015 [place], [place]. This information is not intended to replace advice given to you by your health care provider. Make sure you discuss any questions you have with your health care provider.

## 2014-04-05 NOTE — Progress Notes (Signed)
Reviewed and agree with management plan.  Pricilla Riffle. Fuller Plan, MD Darol Destine

## 2014-04-09 ENCOUNTER — Encounter: Payer: Self-pay | Admitting: Gastroenterology

## 2014-04-09 ENCOUNTER — Ambulatory Visit (AMBULATORY_SURGERY_CENTER): Payer: BLUE CROSS/BLUE SHIELD | Admitting: Gastroenterology

## 2014-04-09 VITALS — BP 137/95 | HR 74 | Temp 97.6°F | Resp 16 | Ht 60.0 in | Wt 104.0 lb

## 2014-04-09 DIAGNOSIS — R1314 Dysphagia, pharyngoesophageal phase: Secondary | ICD-10-CM

## 2014-04-09 DIAGNOSIS — K219 Gastro-esophageal reflux disease without esophagitis: Secondary | ICD-10-CM

## 2014-04-09 DIAGNOSIS — R079 Chest pain, unspecified: Secondary | ICD-10-CM

## 2014-04-09 DIAGNOSIS — K209 Esophagitis, unspecified: Secondary | ICD-10-CM

## 2014-04-09 HISTORY — PX: ESOPHAGOGASTRODUODENOSCOPY: SHX1529

## 2014-04-09 MED ORDER — SODIUM CHLORIDE 0.9 % IV SOLN: 500.0000 mL | INTRAVENOUS | Status: DC

## 2014-04-09 NOTE — Op Note (Signed)
[facility]  [address] [place], [id number]   ENDOSCOPY PROCEDURE REPORT  PATIENT: Sherri Charles, Sherri Charles  MR#: [id number] BIRTHDATE: [birth year] , 57  yrs. old GENDER: female ENDOSCOPIST: Ladene Artist, MD, [facility] PROCEDURE DATE:  04/09/2014 PROCEDURE:  EGD w/ biopsy and EGD w/ wire guided (savary) dilation  ASA CLASS:     Class II INDICATIONS:  dysphagia, chest pain, and history of esophageal reflux. MEDICATIONS: Propofol 250 mg IV TOPICAL ANESTHETIC: none DESCRIPTION OF PROCEDURE: After the risks benefits and alternatives of the procedure were thoroughly explained, informed consent was obtained.  The LB [id number] endoscope was introduced through the mouth and advanced to the second portion of the duodenum , Without limitations.  The instrument was slowly withdrawn as the mucosa was fully examined.    ESOPHAGUS: The z-line was located at 40 cm.  The z line appeared irregular and biopsies were taken.   The esophagus was dilated using a 89mm (48Fr) savary dilator over guidewire for dysphagia without a stricture noted. STOMACH: The mucosa and folds of the stomach appeared normal. DUODENUM: The duodenal mucosa showed no abnormalities.  Retroflexed views revealed a small hiatal hernia.   The scope was then withdrawn from the patient and the procedure completed.  COMPLICATIONS: There were no immediate complications.  ENDOSCOPIC IMPRESSION: 1.   The z-line was irregular; biopsies 2.   The esophagus dilated using a 71mm (48Fr) savary dilator over guidewire 3.   Small hiatal hernia  RECOMMENDATIONS: 1.  Anti-reflux regimen long term 2.  Await pathology results 3.  Post dilation instructions 4.  Continue pantoprazole 40 mg bid and ranitine 150 mg hs 5.  Follow up OV in 4 weeks  eSigned:  Ladene Artist, MD, [facility] 04/09/2014 9:25 AM

## 2014-04-09 NOTE — Progress Notes (Signed)
Called to room to assist during endoscopic procedure.  Patient ID and intended procedure confirmed with present staff. Received instructions for my participation in the procedure from the performing physician.  

## 2014-04-09 NOTE — Progress Notes (Signed)
Procedure ends, to recovery, report given and VSS. 

## 2014-04-09 NOTE — Patient Instructions (Signed)

## 2014-04-10 ENCOUNTER — Telehealth: Payer: Self-pay | Admitting: *Deleted

## 2014-04-10 NOTE — Telephone Encounter (Signed)
  Follow up Call-  Call back number 04/09/2014 12/08/2011  Post procedure Call Back phone  # [phone]  Permission to leave phone message Yes Yes     Patient questions:  Do you have a fever, pain , or abdominal swelling? No. Pain Score  0 *  Have you tolerated food without any problems? Yes.    Have you been able to return to your normal activities? Yes.    Do you have any questions about your discharge instructions: Diet   No. Medications  No. Follow up visit  No.  Do you have questions or concerns about your Care? No.  Actions: * If pain score is 4 or above: No action needed, pain <4.

## 2014-04-15 ENCOUNTER — Telehealth: Payer: Self-pay | Admitting: Gastroenterology

## 2014-04-15 NOTE — Telephone Encounter (Signed)
Per procedure report 04/09/14 patient to remain on pantoprazole and ranitidine.  She is advised to D/C carafate when she is out.  She verbalized understanding.  She will call back for any additional questions or concerns

## 2014-04-17 ENCOUNTER — Encounter: Payer: Self-pay | Admitting: Gastroenterology

## 2014-05-09 ENCOUNTER — Ambulatory Visit (INDEPENDENT_AMBULATORY_CARE_PROVIDER_SITE_OTHER): Payer: BLUE CROSS/BLUE SHIELD | Admitting: Gastroenterology

## 2014-05-09 ENCOUNTER — Other Ambulatory Visit (INDEPENDENT_AMBULATORY_CARE_PROVIDER_SITE_OTHER): Payer: BLUE CROSS/BLUE SHIELD

## 2014-05-09 ENCOUNTER — Encounter: Payer: Self-pay | Admitting: Gastroenterology

## 2014-05-09 VITALS — BP 108/62 | HR 76 | Ht 59.75 in | Wt 106.1 lb

## 2014-05-09 DIAGNOSIS — R140 Abdominal distension (gaseous): Secondary | ICD-10-CM

## 2014-05-09 DIAGNOSIS — K219 Gastro-esophageal reflux disease without esophagitis: Secondary | ICD-10-CM

## 2014-05-09 LAB — IGA: IGA: 153 mg/dL (ref 68–378)

## 2014-05-09 MED ORDER — SUCRALFATE 1 GM/10ML PO SUSP: 1.0000 g | Freq: Three times a day (TID) | ORAL | Status: DC | PRN

## 2014-05-09 NOTE — Progress Notes (Signed)
History of Present Illness: This is a 49 year old female returning for follow-up. She underwent upper endoscopy with an empiric dilation one month ago. A small hiatal hernia was noted on EGD. Her chest pain and dysphagia have completely resolved. Her reflux symptoms have come under very good control on her current regimen. She feels that Carafate would occasionally improve her symptoms. She has noted a new symptom of frequent postprandial fullness and bloating. She notes the symptoms are clearly worse with certain foods including breads and pasta.   Allergies  Allergen Reactions  . Sulfonamide Derivatives     REACTION: rash  . Amoxicillin Rash   Outpatient Prescriptions Prior to Visit  Medication Sig Dispense Refill  . Calcium Carbonate-Vitamin D (CALCIUM 600 + D PO) Take 1 tablet by mouth 2 (two) times daily.     . fish oil-omega-3 fatty acids 1000 MG capsule 4000mg  daily    . multivitamin (THERAGRAN) per tablet Take 1 tablet by mouth daily.      Marland Kitchen nystatin cream (MYCOSTATIN) Apply 1 application topically as needed.     . pantoprazole (PROTONIX) 40 MG tablet Take 1 tablet (40 mg total) by mouth 2 (two) times daily. 60 tablet 3  . ranitidine (ZANTAC) 150 MG tablet Take 1 tablet (150 mg total) by mouth 2 (two) times daily. 60 tablet 0  . sucralfate (CARAFATE) 1 GM/10ML suspension Take 10 mLs (1 g total) by mouth 4 (four) times daily -  with meals and at bedtime. 420 mL 0  . traZODone (DESYREL) 50 MG tablet Take 1.5 tablets (75 mg total) by mouth at bedtime as needed for sleep. (Patient not taking: Reported on 04/04/2014) 60 tablet 11   No facility-administered medications prior to visit.   Past Medical History  Diagnosis Date  . GERD (gastroesophageal reflux disease)   . Overactive bladder     sees Dr. Rosana Hoes  . IBS (irritable bowel syndrome)   . Infertility management     sees Dr. Delila Pereyra  . Dry eyes     sees Dr. Joya San  . Osteopenia     per DEXA 09-28-07  . Thyroid disease     . Hiatal hernia    Past Surgical History  Procedure Laterality Date  . Laparoscopy      x 2  . Cervix surgery      lesion removed from cervix  . Hernia repair      right inguinal   . Urethral dilation      x two, (last 5-10)  . Esophagogastroduodenoscopy  2006    per Dr. Fuller Plan, esophagitis  . Colonoscopy  06-26-09    per Dr. Carlis Abbott, repeat in 10 yrs  . Abdominal hysterectomy  2009    TAH and left SO per Dr. Warnell Forester   History   Social History  . Marital Status: Married    Spouse Name: N/A  . Number of Children: 1 A  . Years of Education: N/A   Occupational History  . Biochemist, clinical   Social History Main Topics  . Smoking status: Never Smoker   . Smokeless tobacco: Never Used  . Alcohol Use: 0.0 oz/week    0 Standard drinks or equivalent per week     Comment: occ  . Drug Use: No  . Sexual Activity: No   Other Topics Concern  . None   Social History Narrative   Adopted a child from [place] 2008   Family History  Problem Relation Age of Onset  . Alcohol abuse  Mother   . Heart disease Mother   . Arthritis Father   . Hypertension Father   . Ovarian cancer Paternal Aunt   . Breast cancer Paternal Aunt       Physical Exam: General: Well developed , well nourished, no acute distress Head: Normocephalic and atraumatic Eyes:  sclerae anicteric, EOMI Ears: Normal auditory acuity Mouth: No deformity or lesions Lungs: Clear throughout to auscultation Heart: Regular rate and rhythm; no murmurs, rubs or bruits Abdomen: Soft, non tender and non distended. No masses, hepatosplenomegaly or hernias noted. Normal Bowel sounds Musculoskeletal: Symmetrical with no gross deformities  Extremities: No clubbing, cyanosis, edema or deformities noted Neurological: Alert oriented x 4, grossly nonfocal Psychological:  Alert and cooperative. Normal mood and affect  Assessment and Recommendations:  1. GERD. Continue standard antireflux measures, pantoprazole 40 mg twice a day and  ranitidine 300 mg at bedtime. Carafate suspension 1 g 3 times a day as needed.  2. Postprandial bloating and fullness. Possible gastroparesis. Rule out less likely possibility of celiac disease. IgA and tTG today. Diet modifications to minimize symptoms. If symptoms do not significantly improved consider further evaluation with an abd [place] and gastric emptying scan.

## 2014-05-09 NOTE — Patient Instructions (Addendum)
Your physician has requested that you go to the basement for lab work before leaving today. We have sent  medications to your pharmacy for you to pick up at your convenience. CC:  Alysia Penna MD

## 2014-05-10 LAB — TISSUE TRANSGLUTAMINASE, IGA: Tissue Transglutaminase Ab, IgA: 1 U/mL (ref <4)

## 2014-05-21 ENCOUNTER — Telehealth: Payer: Self-pay | Admitting: *Deleted

## 2014-05-21 NOTE — Telephone Encounter (Signed)
We received a fax from [facility] regarding this patient's Pantoprazole sodium 40 mg. This patient takes this medication for GERD.  Twice daily therapy. Alonza Bogus PA-C filled out the form and we faxed it to [facility], Fax # [phone].

## 2014-06-05 ENCOUNTER — Other Ambulatory Visit: Payer: Self-pay | Admitting: Endocrinology

## 2014-06-05 ENCOUNTER — Ambulatory Visit
Admission: RE | Admit: 2014-06-05 | Discharge: 2014-06-05 | Disposition: A | Discharge status: Home / Self Care | Payer: BLUE CROSS/BLUE SHIELD | Source: Ambulatory Visit | Attending: Endocrinology | Admitting: Endocrinology

## 2014-06-05 DIAGNOSIS — E049 Nontoxic goiter, unspecified: Secondary | ICD-10-CM

## 2014-06-07 ENCOUNTER — Telehealth: Payer: Self-pay | Admitting: Gastroenterology

## 2014-06-07 DIAGNOSIS — K219 Gastro-esophageal reflux disease without esophagitis: Secondary | ICD-10-CM

## 2014-06-07 MED ORDER — RANITIDINE HCL 150 MG PO TABS: 150.0000 mg | ORAL_TABLET | Freq: Two times a day (BID) | ORAL | Status: DC

## 2014-06-07 NOTE — Telephone Encounter (Signed)
Per Dr. Fuller Plan on last office not patient should continue Zantac 300 mg. Told patient we will send medication to her pharmacy. Pt verbalized understanding.

## 2014-07-05 ENCOUNTER — Other Ambulatory Visit: Payer: Self-pay | Admitting: Endocrinology

## 2014-07-05 ENCOUNTER — Other Ambulatory Visit: Payer: Self-pay

## 2014-07-05 DIAGNOSIS — E049 Nontoxic goiter, unspecified: Secondary | ICD-10-CM

## 2014-07-05 DIAGNOSIS — K219 Gastro-esophageal reflux disease without esophagitis: Secondary | ICD-10-CM

## 2014-07-05 MED ORDER — PANTOPRAZOLE SODIUM 40 MG PO TBEC: 40.0000 mg | DELAYED_RELEASE_TABLET | Freq: Two times a day (BID) | ORAL | Status: DC

## 2014-07-09 ENCOUNTER — Ambulatory Visit
Admission: RE | Admit: 2014-07-09 | Discharge: 2014-07-09 | Disposition: A | Discharge status: Home / Self Care | Payer: BLUE CROSS/BLUE SHIELD | Source: Ambulatory Visit | Attending: Endocrinology | Admitting: Endocrinology

## 2014-07-09 ENCOUNTER — Other Ambulatory Visit (HOSPITAL_COMMUNITY)
Admission: RE | Admit: 2014-07-09 | Discharge: 2014-07-09 | Disposition: A | Discharge status: Home / Self Care | Payer: BLUE CROSS/BLUE SHIELD | Source: Ambulatory Visit | Attending: Interventional Radiology | Admitting: Interventional Radiology

## 2014-07-09 DIAGNOSIS — E049 Nontoxic goiter, unspecified: Secondary | ICD-10-CM

## 2014-07-09 DIAGNOSIS — E041 Nontoxic single thyroid nodule: Secondary | ICD-10-CM | POA: Diagnosis present

## 2014-07-16 ENCOUNTER — Telehealth: Payer: Self-pay | Admitting: Gastroenterology

## 2014-07-16 MED ORDER — PANTOPRAZOLE SODIUM 40 MG PO TBEC: 40.0000 mg | DELAYED_RELEASE_TABLET | Freq: Two times a day (BID) | ORAL | Status: DC

## 2014-07-16 NOTE — Telephone Encounter (Signed)
Prescription sent to [facility] for 90 day supply and patient notified.

## 2014-07-19 ENCOUNTER — Other Ambulatory Visit: Payer: Self-pay

## 2014-07-19 DIAGNOSIS — Z1231 Encounter for screening mammogram for malignant neoplasm of breast: Secondary | ICD-10-CM

## 2014-08-07 ENCOUNTER — Ambulatory Visit: Payer: Self-pay | Admitting: Surgery

## 2014-08-19 NOTE — Patient Instructions (Addendum)
Sherri Charles  08/19/2014   Your procedure is scheduled on: 08/29/2014    Report to [facility] Main  Entrance and follow signs to               [place] at      1000 AM.  Call this number if you   have problems the morning of surgery [phone]   Remember: ONLY 1 PERSON MAY GO WITH YOU TO SHORT STAY TO GET  READY MORNING OF [place].  Do not eat food or drink liquids :After Midnight.     Take these medicines the morning of surgery with A SIP OF WATER: Protonix, eye drops if needed                                 You may not have any metal on your body including hair pins and              piercings  Do not wear jewelry, make-up, lotions, powders or perfumes, deodorant             Do not wear nail polish.  Do not shave  48 hours prior to surgery.     Do not bring valuables to the hospital. [place].  Contacts, dentures or bridgework may not be worn into surgery.  Leave suitcase in the car. After surgery it may be brought to your room.         Special Instructions coughing and deep breathing exercises, leg exercises               Please read over the following fact sheets you were given: _____________________________________________________________________             [facility] - Preparing for Surgery Before surgery, you can play an important role.  Because skin is not sterile, your skin needs to be as free of germs as possible.  You can reduce the number of germs on your skin by washing with CHG (chlorahexidine gluconate) soap before surgery.  CHG is an antiseptic cleaner which kills germs and bonds with the skin to continue killing germs even after washing. Please DO NOT use if you have an allergy to CHG or antibacterial soaps.  If your skin becomes reddened/irritated stop using the CHG and inform your nurse when you arrive at Short Stay. Do not shave (including legs and underarms) for at  least 48 hours prior to the first CHG shower.  You may shave your face/neck. Please follow these instructions carefully:  1.  Shower with CHG Soap the night before surgery and the  morning of Surgery.  2.  If you choose to wash your hair, wash your hair first as usual with your  normal  shampoo.  3.  After you shampoo, rinse your hair and body thoroughly to remove the  shampoo.                           4.  Use CHG as you would any other liquid soap.  You can apply chg directly  to the skin and wash  Gently with a scrungie or clean washcloth.  5.  Apply the CHG Soap to your body ONLY FROM THE NECK DOWN.   Do not use on face/ open                           Wound or open sores. Avoid contact with eyes, ears mouth and genitals (private parts).                       Wash face,  Genitals (private parts) with your normal soap.             6.  Wash thoroughly, paying special attention to the area where your surgery  will be performed.  7.  Thoroughly rinse your body with warm water from the neck down.  8.  DO NOT shower/wash with your normal soap after using and rinsing off  the CHG Soap.                9.  Pat yourself dry with a clean towel.            10.  Wear clean pajamas.            11.  Place clean sheets on your bed the night of your first shower and do not  sleep with pets. Day of Surgery : Do not apply any lotions/deodorants the morning of surgery.  Please wear clean clothes to the hospital/surgery center.  FAILURE TO FOLLOW THESE INSTRUCTIONS MAY RESULT IN THE CANCELLATION OF YOUR SURGERY PATIENT SIGNATURE_________________________________  NURSE SIGNATURE__________________________________  ________________________________________________________________________

## 2014-08-21 ENCOUNTER — Encounter (HOSPITAL_COMMUNITY): Payer: Self-pay

## 2014-08-21 ENCOUNTER — Encounter (HOSPITAL_COMMUNITY)
Admission: RE | Admit: 2014-08-21 | Discharge: 2014-08-21 | Disposition: A | Discharge status: Home / Self Care | Payer: BLUE CROSS/BLUE SHIELD | Source: Ambulatory Visit | Attending: Surgery | Admitting: Surgery

## 2014-08-21 DIAGNOSIS — D497 Neoplasm of unspecified behavior of endocrine glands and other parts of nervous system: Secondary | ICD-10-CM | POA: Diagnosis not present

## 2014-08-21 DIAGNOSIS — Z01812 Encounter for preprocedural laboratory examination: Secondary | ICD-10-CM | POA: Insufficient documentation

## 2014-08-21 HISTORY — DX: Nausea with vomiting, unspecified: R11.2

## 2014-08-21 HISTORY — DX: Other specified postprocedural states: Z98.890

## 2014-08-21 LAB — CBC
HCT: 39.1 % (ref 36.0–46.0)
Hemoglobin: 12.6 g/dL (ref 12.0–15.0)
MCH: 29.5 pg (ref 26.0–34.0)
MCHC: 32.2 g/dL (ref 30.0–36.0)
MCV: 91.6 fL (ref 78.0–100.0)
PLATELETS: 214 10*3/uL (ref 150–400)
RBC: 4.27 MIL/uL (ref 3.87–5.11)
RDW: 13.5 % (ref 11.5–15.5)
WBC: 4.6 10*3/uL (ref 4.0–10.5)

## 2014-08-21 NOTE — Progress Notes (Signed)
CXR and EKG done 04/04/14 in [facility]

## 2014-08-22 NOTE — Progress Notes (Signed)
Quick Note:  These results are acceptable for scheduled surgery.  Corban Kistler M. Angelica Wix, MD, FACS [facility] Office: [phone]   ______ 

## 2014-08-29 ENCOUNTER — Encounter (HOSPITAL_COMMUNITY): Payer: Self-pay | Admitting: Surgery

## 2014-08-29 ENCOUNTER — Encounter (HOSPITAL_COMMUNITY)
Admission: RE | Disposition: A | Discharge status: Home / Self Care | Payer: Self-pay | Source: Ambulatory Visit | Attending: Surgery

## 2014-08-29 ENCOUNTER — Ambulatory Visit (HOSPITAL_COMMUNITY): Payer: BLUE CROSS/BLUE SHIELD | Admitting: Certified Registered Nurse Anesthetist

## 2014-08-29 ENCOUNTER — Observation Stay (HOSPITAL_COMMUNITY)
Admission: RE | Admit: 2014-08-29 | Discharge: 2014-08-30 | Disposition: A | Discharge status: Home / Self Care | Payer: BLUE CROSS/BLUE SHIELD | Source: Ambulatory Visit | Attending: Surgery | Admitting: Surgery

## 2014-08-29 DIAGNOSIS — K449 Diaphragmatic hernia without obstruction or gangrene: Secondary | ICD-10-CM | POA: Insufficient documentation

## 2014-08-29 DIAGNOSIS — K219 Gastro-esophageal reflux disease without esophagitis: Secondary | ICD-10-CM | POA: Diagnosis not present

## 2014-08-29 DIAGNOSIS — E042 Nontoxic multinodular goiter: Principal | ICD-10-CM | POA: Insufficient documentation

## 2014-08-29 DIAGNOSIS — E079 Disorder of thyroid, unspecified: Secondary | ICD-10-CM | POA: Insufficient documentation

## 2014-08-29 DIAGNOSIS — Z8349 Family history of other endocrine, nutritional and metabolic diseases: Secondary | ICD-10-CM | POA: Diagnosis not present

## 2014-08-29 DIAGNOSIS — Z79899 Other long term (current) drug therapy: Secondary | ICD-10-CM | POA: Diagnosis not present

## 2014-08-29 DIAGNOSIS — Z793 Long term (current) use of hormonal contraceptives: Secondary | ICD-10-CM | POA: Insufficient documentation

## 2014-08-29 DIAGNOSIS — D440 Neoplasm of uncertain behavior of thyroid gland: Secondary | ICD-10-CM | POA: Diagnosis present

## 2014-08-29 HISTORY — PX: THYROIDECTOMY: SHX17

## 2014-08-29 SURGERY — THYROIDECTOMY
Anesthesia: General | Site: Neck

## 2014-08-29 MED ORDER — NEOSTIGMINE METHYLSULFATE 10 MG/10ML IV SOLN
INTRAVENOUS | Status: DC | PRN
  Administered 2014-08-29: 3 mg via INTRAVENOUS

## 2014-08-29 MED ORDER — CIPROFLOXACIN IN D5W 400 MG/200ML IV SOLN
400.0000 mg | INTRAVENOUS | Status: AC
  Administered 2014-08-29: 400 mg via INTRAVENOUS

## 2014-08-29 MED ORDER — FENTANYL CITRATE (PF) 100 MCG/2ML IJ SOLN
25.0000 ug | INTRAMUSCULAR | Status: DC | PRN
  Administered 2014-08-29 (×2): 25 ug via INTRAVENOUS
  Administered 2014-08-29: 50 ug via INTRAVENOUS

## 2014-08-29 MED ORDER — NEOSTIGMINE METHYLSULFATE 10 MG/10ML IV SOLN
INTRAVENOUS | Status: AC
  Filled 2014-08-29: qty 1

## 2014-08-29 MED ORDER — PHENYLEPHRINE 40 MCG/ML (10ML) SYRINGE FOR IV PUSH (FOR BLOOD PRESSURE SUPPORT)
PREFILLED_SYRINGE | INTRAVENOUS | Status: AC
  Filled 2014-08-29: qty 10

## 2014-08-29 MED ORDER — LIDOCAINE HCL (CARDIAC) 20 MG/ML IV SOLN
INTRAVENOUS | Status: DC | PRN
  Administered 2014-08-29: 50 mg via INTRAVENOUS

## 2014-08-29 MED ORDER — SUCCINYLCHOLINE CHLORIDE 20 MG/ML IJ SOLN
INTRAMUSCULAR | Status: DC | PRN
  Administered 2014-08-29: 100 mg via INTRAVENOUS

## 2014-08-29 MED ORDER — LIDOCAINE HCL (CARDIAC) 20 MG/ML IV SOLN
INTRAVENOUS | Status: AC
  Filled 2014-08-29: qty 5

## 2014-08-29 MED ORDER — DEXAMETHASONE SODIUM PHOSPHATE 10 MG/ML IJ SOLN
INTRAMUSCULAR | Status: AC
  Filled 2014-08-29: qty 1

## 2014-08-29 MED ORDER — FENTANYL CITRATE (PF) 100 MCG/2ML IJ SOLN
INTRAMUSCULAR | Status: AC
  Filled 2014-08-29: qty 2

## 2014-08-29 MED ORDER — MEPERIDINE HCL 50 MG/ML IJ SOLN: 6.2500 mg | INTRAMUSCULAR | Status: DC | PRN

## 2014-08-29 MED ORDER — 0.9 % SODIUM CHLORIDE (POUR BTL) OPTIME
TOPICAL | Status: DC | PRN
  Administered 2014-08-29: 1000 mL

## 2014-08-29 MED ORDER — LACTATED RINGERS IV SOLN: INTRAVENOUS | Status: DC

## 2014-08-29 MED ORDER — MIDAZOLAM HCL 2 MG/2ML IJ SOLN
INTRAMUSCULAR | Status: AC
  Filled 2014-08-29: qty 2

## 2014-08-29 MED ORDER — ONDANSETRON HCL 4 MG/2ML IJ SOLN
INTRAMUSCULAR | Status: AC
  Filled 2014-08-29: qty 2

## 2014-08-29 MED ORDER — DEXAMETHASONE SODIUM PHOSPHATE 4 MG/ML IJ SOLN
INTRAMUSCULAR | Status: DC | PRN
  Administered 2014-08-29: 10 mg via INTRAVENOUS

## 2014-08-29 MED ORDER — CISATRACURIUM BESYLATE 20 MG/10ML IV SOLN
INTRAVENOUS | Status: AC
  Filled 2014-08-29: qty 10

## 2014-08-29 MED ORDER — FENTANYL CITRATE (PF) 100 MCG/2ML IJ SOLN
INTRAMUSCULAR | Status: DC | PRN
  Administered 2014-08-29 (×2): 50 ug via INTRAVENOUS

## 2014-08-29 MED ORDER — GLYCOPYRROLATE 0.2 MG/ML IJ SOLN
INTRAMUSCULAR | Status: DC | PRN
  Administered 2014-08-29: 0.4 mg via INTRAVENOUS

## 2014-08-29 MED ORDER — PROPOFOL 10 MG/ML IV BOLUS
INTRAVENOUS | Status: AC
  Filled 2014-08-29: qty 20

## 2014-08-29 MED ORDER — FENTANYL CITRATE (PF) 250 MCG/5ML IJ SOLN
INTRAMUSCULAR | Status: AC
  Filled 2014-08-29: qty 5

## 2014-08-29 MED ORDER — ONDANSETRON HCL 4 MG PO TABS: 4.0000 mg | ORAL_TABLET | Freq: Four times a day (QID) | ORAL | Status: DC | PRN

## 2014-08-29 MED ORDER — KCL IN DEXTROSE-NACL 20-5-0.9 MEQ/L-%-% IV SOLN
INTRAVENOUS | Status: AC
  Filled 2014-08-29: qty 1000

## 2014-08-29 MED ORDER — PHENYLEPHRINE HCL 10 MG/ML IJ SOLN
INTRAMUSCULAR | Status: DC | PRN
  Administered 2014-08-29: 120 ug via INTRAVENOUS
  Administered 2014-08-29 (×2): 80 ug via INTRAVENOUS
  Administered 2014-08-29: 40 ug via INTRAVENOUS
  Administered 2014-08-29: 80 ug via INTRAVENOUS

## 2014-08-29 MED ORDER — PANTOPRAZOLE SODIUM 40 MG PO TBEC
40.0000 mg | DELAYED_RELEASE_TABLET | Freq: Two times a day (BID) | ORAL | Status: DC
  Administered 2014-08-29: 40 mg via ORAL
  Filled 2014-08-29 (×3): qty 1

## 2014-08-29 MED ORDER — ONDANSETRON HCL 4 MG/2ML IJ SOLN
INTRAMUSCULAR | Status: DC | PRN
Start: 2014-08-29 — End: 2014-08-29
  Administered 2014-08-29: 4 mg via INTRAVENOUS

## 2014-08-29 MED ORDER — ACETAMINOPHEN 325 MG PO TABS: 650.0000 mg | ORAL_TABLET | ORAL | Status: DC | PRN

## 2014-08-29 MED ORDER — CALCIUM CARBONATE 1250 (500 CA) MG PO TABS
2.0000 | ORAL_TABLET | Freq: Three times a day (TID) | ORAL | Status: DC
  Filled 2014-08-29 (×5): qty 2

## 2014-08-29 MED ORDER — KCL IN DEXTROSE-NACL 20-5-0.45 MEQ/L-%-% IV SOLN
INTRAVENOUS | Status: DC
  Administered 2014-08-29: 50 mL/h via INTRAVENOUS
  Filled 2014-08-29 (×2): qty 1000

## 2014-08-29 MED ORDER — HYDROMORPHONE HCL 1 MG/ML IJ SOLN
1.0000 mg | INTRAMUSCULAR | Status: DC | PRN
  Filled 2014-08-29: qty 1

## 2014-08-29 MED ORDER — METOCLOPRAMIDE HCL 5 MG/ML IJ SOLN
INTRAMUSCULAR | Status: AC
  Filled 2014-08-29: qty 2

## 2014-08-29 MED ORDER — PROPOFOL 10 MG/ML IV BOLUS
INTRAVENOUS | Status: DC | PRN
  Administered 2014-08-29: 140 mg via INTRAVENOUS

## 2014-08-29 MED ORDER — ONDANSETRON HCL 4 MG/2ML IJ SOLN: 4.0000 mg | Freq: Four times a day (QID) | INTRAMUSCULAR | Status: DC | PRN

## 2014-08-29 MED ORDER — MIDAZOLAM HCL 5 MG/5ML IJ SOLN
INTRAMUSCULAR | Status: DC | PRN
  Administered 2014-08-29 (×2): 1 mg via INTRAVENOUS

## 2014-08-29 MED ORDER — LACTATED RINGERS IV SOLN
INTRAVENOUS | Status: DC
  Administered 2014-08-29: 11:00:00 via INTRAVENOUS
  Administered 2014-08-29: 1000 mL via INTRAVENOUS

## 2014-08-29 MED ORDER — PROMETHAZINE HCL 25 MG/ML IJ SOLN: 6.2500 mg | INTRAMUSCULAR | Status: DC | PRN

## 2014-08-29 MED ORDER — HYDROCODONE-ACETAMINOPHEN 5-325 MG PO TABS
1.0000 | ORAL_TABLET | ORAL | Status: DC | PRN
  Administered 2014-08-29 (×2): 1 via ORAL
  Filled 2014-08-29 (×2): qty 1

## 2014-08-29 MED ORDER — CIPROFLOXACIN IN D5W 400 MG/200ML IV SOLN
INTRAVENOUS | Status: AC
  Filled 2014-08-29: qty 200

## 2014-08-29 MED ORDER — METOCLOPRAMIDE HCL 5 MG/ML IJ SOLN
INTRAMUSCULAR | Status: DC | PRN
  Administered 2014-08-29: 10 mg via INTRAVENOUS

## 2014-08-29 MED ORDER — CISATRACURIUM BESYLATE (PF) 10 MG/5ML IV SOLN
INTRAVENOUS | Status: DC | PRN
  Administered 2014-08-29: 6 mg via INTRAVENOUS

## 2014-08-29 MED ORDER — GLYCOPYRROLATE 0.2 MG/ML IJ SOLN
INTRAMUSCULAR | Status: AC
  Filled 2014-08-29: qty 2

## 2014-08-29 SURGICAL SUPPLY — 37 items
APL SKNCLS STERI-STRIP NONHPOA (GAUZE/BANDAGES/DRESSINGS) ×1
ATTRACTOMAT 16X20 MAGNETIC DRP (DRAPES) ×3 IMPLANT
BENZOIN TINCTURE PRP APPL 2/3 (GAUZE/BANDAGES/DRESSINGS) ×2 IMPLANT
BLADE HEX COATED 2.75 (ELECTRODE) ×3 IMPLANT
BLADE SURG 15 STRL LF DISP TIS (BLADE) ×1 IMPLANT
BLADE SURG 15 STRL SS (BLADE) ×3
CHLORAPREP W/TINT 26ML (MISCELLANEOUS) ×3 IMPLANT
CLIP TI MEDIUM 6 (CLIP) ×6 IMPLANT
CLIP TI WIDE RED SMALL 6 (CLIP) ×6 IMPLANT
CLOSURE WOUND 1/2 X4 (GAUZE/BANDAGES/DRESSINGS) ×1
DISSECTOR ROUND CHERRY 3/8 STR (MISCELLANEOUS) IMPLANT
DRAPE LAPAROTOMY T 98X78 PEDS (DRAPES) ×3 IMPLANT
DRESSING SURGICEL FIBRLLR 1X2 (HEMOSTASIS) ×1 IMPLANT
DRSG SURGICEL FIBRILLAR 1X2 (HEMOSTASIS) ×3
ELECT REM PT RETURN 9FT ADLT (ELECTROSURGICAL) ×3
ELECTRODE REM PT RTRN 9FT ADLT (ELECTROSURGICAL) ×1 IMPLANT
GAUZE SPONGE 4X4 12PLY STRL (GAUZE/BANDAGES/DRESSINGS) IMPLANT
GAUZE SPONGE 4X4 16PLY XRAY LF (GAUZE/BANDAGES/DRESSINGS) ×3 IMPLANT
GLOVE SURG ORTHO 8.0 STRL STRW (GLOVE) ×3 IMPLANT
GOWN STRL REUS W/TWL XL LVL3 (GOWN DISPOSABLE) ×6 IMPLANT
KIT BASIN OR (CUSTOM PROCEDURE TRAY) ×3 IMPLANT
LIQUID BAND (GAUZE/BANDAGES/DRESSINGS) ×3 IMPLANT
PACK BASIC VI WITH GOWN DISP (CUSTOM PROCEDURE TRAY) ×3 IMPLANT
PENCIL BUTTON HOLSTER BLD 10FT (ELECTRODE) ×3 IMPLANT
SHEARS HARMONIC 9CM CVD (BLADE) ×5 IMPLANT
STAPLER VISISTAT 35W (STAPLE) IMPLANT
STRIP CLOSURE SKIN 1/2X4 (GAUZE/BANDAGES/DRESSINGS) ×2 IMPLANT
SUT MNCRL AB 4-0 PS2 18 (SUTURE) ×3 IMPLANT
SUT SILK 2 0 (SUTURE)
SUT SILK 2-0 18XBRD TIE 12 (SUTURE) IMPLANT
SUT SILK 3 0 (SUTURE)
SUT SILK 3-0 18XBRD TIE 12 (SUTURE) IMPLANT
SUT VIC AB 3-0 SH 18 (SUTURE) ×6 IMPLANT
SYR BULB IRRIGATION 50ML (SYRINGE) ×3 IMPLANT
TOWEL OR 17X26 10 PK STRL BLUE (TOWEL DISPOSABLE) ×3 IMPLANT
TOWEL OR NON WOVEN STRL DISP B (DISPOSABLE) ×3 IMPLANT
YANKAUER SUCT BULB TIP 10FT TU (MISCELLANEOUS) ×3 IMPLANT

## 2014-08-29 NOTE — Op Note (Signed)
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 Total thyroidectomy  2. Autotransplant left inferior parathyroid to SCM muscle  Anesthesia:  General  Estimated Blood Loss:  minimal  Drains: none         Specimen: thyroid to pathology  Indications:  Patient is referred by Dr. Jacelyn Pi for evaluation of thyroid neoplasm of uncertain behavior. Patient was originally diagnosed with thyroid nodules over 2 years ago on physical examination by a physician. She subsequently underwent ultrasound which showed bilateral thyroid nodules. Patient was referred to endocrinology where she has been followed with sequential ultrasound scanning. She is not on thyroid medication. Ultrasound in March 2016 showed interval enlargement of bilateral dominant thyroid nodules. Patient underwent fine-needle aspiration biopsy which showed cytologic atypia in the right thyroid nodule which measured 19 mm in size. 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 Patient is referred to surgery for consideration for resection for definitive diagnosis.  Procedure Details: Procedure was done in OR #1 at the [facility].  The patient was brought to the operating room and placed in a supine position on the operating room table.  Following administration of general anesthesia, the patient was positioned and then prepped and draped in the usual aseptic fashion.  After ascertaining that an adequate level of anesthesia had been achieved, a Kocher incision was made with #15 blade.  Dissection was carried through subcutaneous tissues and platysma. Hemostasis was achieved with the electrocautery.  Skin flaps were elevated cephalad and caudad from the thyroid notch to the sternal notch.  The Mahorner self-retaining retractor was placed for exposure.  Strap  muscles were incised in the midline and dissection was begun on the left side.  Strap muscles were reflected laterally.  Left thyroid lobe was normal in size with small nodules.  The left lobe was gently mobilized with blunt dissection.  Superior pole vessels were dissected out and divided individually between small and medium Ligaclips with the Harmonic scalpel.  The thyroid lobe was rolled anteriorly.  Branches of the inferior thyroid artery were divided between small Ligaclips with the Harmonic scalpel.  Inferior venous tributaries were divided between Ligaclips.  The superior parathyroid gland was identified and preserved on its vascular pedicle.  The left inferior gland was on the thyroid capsule and could not be dissected off intact.  Therefore it was excised, sectioned into small fragments, and reimplanted in the left sternocleidomastoid muscle.  The SCM was closed with a vicryl suture and the site marked with a medium ligaclip. The recurrent laryngeal nerve was identified and preserved along its course.  The ligament of Gwenlyn Found was released with the electrocautery and the gland was mobilized onto the anterior trachea. Isthmus was mobilized across the midline.  There was a moderate pyramidal lobe present which was resected with the isthmus.  Dry pack was placed in the left neck.  Next, the right thyroid lobe was gently mobilized with blunt dissection.  Right thyroid lobe was slightly larger with larger nodules present.  Superior pole vessels were dissected out and divided between small and medium Ligaclips with the Harmonic scalpel.  Superior parathyroid was identified and preserved.  Inferior venous tributaries were divided between medium Ligaclips with the Harmonic scalpel.  The right thyroid lobe was rolled anteriorly and the branches of the inferior thyroid artery divided between small Ligaclips.  The right recurrent laryngeal nerve was identified and preserved along its course.  The ligament of Gwenlyn Found was  released with the electrocautery.  The right thyroid lobe was mobilized onto the anterior trachea and the remainder of the thyroid was dissected off the anterior trachea and the thyroid was completely excised.  A suture was used to mark the right superior pole. The entire thyroid gland was submitted to pathology for review.  The neck was irrigated with warm saline.  Fibular was placed throughout the operative field.  Strap muscles were reapproximated in the midline with interrupted 3-0 Vicryl sutures.  Platysma was closed with interrupted 3-0 Vicryl sutures.  Skin was closed with a running 4-0 Monocryl subcuticular suture.  Wound was washed and dried and benzoin and steri-strips were applied.  Dry gauze dressing was placed.  The patient was awakened from anesthesia and brought to the recovery room.  The patient tolerated the procedure well.   [name], MD, [facility] Office: [phone]

## 2014-08-29 NOTE — Transfer of Care (Signed)
Immediate Anesthesia Transfer of Care Note  Patient: Sherri Charles  Procedure(s) Performed: Procedure(s): TOTAL THYROIDECTOMY (N/A)  Patient Location: PACU  Anesthesia Type:General  Level of Consciousness: Patient easily awoken, sedated, comfortable, cooperative, following commands, responds to stimulation.   Airway & Oxygen Therapy: Patient spontaneously breathing, ventilating well, oxygen via simple oxygen mask.  Post-op Assessment: Report given to PACU RN, vital signs reviewed and stable, moving all extremities.   Post vital signs: Reviewed and stable.  Complications: No apparent anesthesia complications

## 2014-08-29 NOTE — Anesthesia Preprocedure Evaluation (Addendum)
Anesthesia Evaluation  Patient identified by MRN, date of birth, ID band Patient awake    Reviewed: Allergy & Precautions, NPO status , Patient's Chart, lab work & pertinent test results  History of Anesthesia Complications (+) PONV  Airway Mallampati: II  TM Distance: >3 FB Neck ROM: Full    Dental no notable dental hx. (+) Caps   Pulmonary neg pulmonary ROS,  breath sounds clear to auscultation  Pulmonary exam normal       Cardiovascular negative cardio ROS Normal cardiovascular examRhythm:Regular Rate:Normal     Neuro/Psych negative neurological ROS  negative psych ROS   GI/Hepatic Neg liver ROS, hiatal hernia,   Endo/Other  negative endocrine ROS  Renal/GU negative Renal ROS  negative genitourinary   Musculoskeletal negative musculoskeletal ROS (+)   Abdominal   Peds negative pediatric ROS (+)  Hematology negative hematology ROS (+)   Anesthesia Other Findings   Reproductive/Obstetrics negative OB ROS                            Anesthesia Physical Anesthesia Plan  ASA: II  Anesthesia Plan: General   Post-op Pain Management:    Induction: Intravenous  Airway Management Planned: Oral ETT  Additional Equipment:   Intra-op Plan:   Post-operative Plan: Extubation in OR  Informed Consent: I have reviewed the patients History and Physical, chart, labs and discussed the procedure including the risks, benefits and alternatives for the proposed anesthesia with the patient or authorized representative who has indicated his/her understanding and acceptance.   Dental advisory given  Plan Discussed with: CRNA  Anesthesia Plan Comments:         Anesthesia Quick Evaluation

## 2014-08-29 NOTE — Anesthesia Procedure Notes (Signed)
Procedure Name: Intubation Date/Time: 08/29/2014 10:07 AM Performed by: Deliah Boston Pre-anesthesia Checklist: Patient identified, Emergency Drugs available, Suction available and Patient being monitored Patient Re-evaluated:Patient Re-evaluated prior to inductionOxygen Delivery Method: Circle System Utilized Preoxygenation: Pre-oxygenation with 100% oxygen Intubation Type: IV induction Ventilation: Mask ventilation without difficulty Laryngoscope Size: Mac and 3 Tube type: Oral Tube size: 7.0 mm Number of attempts: 1 Airway Equipment and Method: Oral airway Placement Confirmation: ETT inserted through vocal cords under direct vision,  positive ETCO2 and breath sounds checked- equal and bilateral Secured at: 20 cm Tube secured with: Tape Dental Injury: Teeth and Oropharynx as per pre-operative assessment

## 2014-08-29 NOTE — H&P (Signed)
General Surgery [facility]  Sherri Charles 08/07/2014 10:20 AM Location: [facility] Patient #: [id number] DOB: 1966-03-23 Married / Language: Sherri Charles / Race: White Female  History of Present Illness Sherri Regal MD; 08/07/2014 11:15 AM)  The patient is a 49 year old female who presents with a thyroid nodule. Patient is referred by Dr. Jacelyn Charles for evaluation of thyroid neoplasm of uncertain behavior. Patient was originally diagnosed with thyroid nodules over 2 years ago on physical examination by a physician. She subsequently underwent ultrasound which showed bilateral thyroid nodules. Patient was referred to endocrinology where she has been followed with sequential ultrasound scanning. She is not on thyroid medication. Ultrasound in March 2016 showed interval enlargement of bilateral dominant thyroid nodules. Patient underwent fine-needle aspiration biopsy which showed cytologic atypia in the right thyroid nodule which measured 19 mm in size. 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 Patient is referred to surgery for consideration for resection for definitive diagnosis. Left thyroid nodule was Bethesda category 2, benign. Patient has never been on thyroid medication. She has had no prior surgery on the head or neck. There is a family history of thyroid disease in the patient's father who underwent total thyroidectomy in 1964. There is no other family history of endocrine neoplasms. Patient denies any tremors or palpitations. She does have mild globus sensation. She denies dysphagia or dyspnea.   Other Problems Sherri Lorenzo, LPN; 0/45/4098 11:91 AM) Bladder Problems Gastroesophageal Reflux Disease General anesthesia - complications Oophorectomy Left. Thyroid Disease  Past Surgical History Sherri Lorenzo, LPN; 4/78/2956 21:30 AM) Colon Polyp Removal - Colonoscopy Hysterectomy (not due to cancer) -  Partial  Diagnostic Studies History Sherri Lorenzo, LPN; 8/65/7846 96:29 AM) Colonoscopy 1-5 years ago Mammogram within last year Pap Smear 1-5 years ago  Allergies Sherri Lorenzo, LPN; 08/24/4130 44:01 AM) Sulfa Antibiotics Amoxicillin *PENICILLINS* Rash.  Medication History Sherri Lorenzo, LPN; 0/27/2536 64:40 AM) Carafate (1GM/10ML Suspension, Oral) Active. Pantoprazole Sodium (40MG  Tablet DR, Oral) Active. Ranitidine HCl (150MG  Tablet, Oral) Active. Calcium-Vitamin D (600MG  Tablet Chewable, Oral) Active. Fish Oil Concentrate (1000MG  Capsule, Oral) Active. Multivitamin (Oral) Active. Mycostatin (100000 U/GM Ointment, External) Active. Medications Reconciled  Social History Sherri Lorenzo, LPN; 3/47/4259 56:38 AM) Alcohol use Occasional alcohol use. Caffeine use Tea. No drug use Tobacco use Never smoker.  Family History Sherri Lorenzo, LPN; 7/56/4332 95:18 AM) Alcohol Abuse Mother. Arthritis Father. Melanoma Father. Thyroid problems Father.  Pregnancy / Birth History Sherri Lorenzo, LPN; 8/41/6606 30:16 AM) Age at menarche 17 years. Contraceptive History Oral contraceptives. Gravida 0 Para 0  Review of Systems Sherri Billings Dockery LPN; 0/12/9321 55:73 AM) General Not Present- Appetite Loss, Chills, Fatigue, Fever, Night Sweats, Weight Gain and Weight Loss. Skin Not Present- Change in Wart/Mole, Dryness, Hives, Jaundice, New Lesions, Non-Healing Wounds, Rash and Ulcer. HEENT Present- Wears glasses/contact lenses. Not Present- Earache, Hearing Loss, Hoarseness, Nose Bleed, Oral Ulcers, Ringing in the Ears, Seasonal Allergies, Sinus Pain, Sore Throat, Visual Disturbances and Yellow Eyes. Respiratory Not Present- Bloody sputum, Chronic Cough, Difficulty Breathing, Snoring and Wheezing. Breast Not Present- Breast Mass, Breast Pain, Nipple Discharge and Skin Changes. Cardiovascular Not Present- Chest Pain, Difficulty Breathing Lying Down, Leg Cramps,  Palpitations, Rapid Heart Rate, Shortness of Breath and Swelling of Extremities. Gastrointestinal Not Present- Abdominal Pain, Bloating, Bloody Stool, Change in Bowel Habits, Chronic diarrhea, Constipation, Difficulty Swallowing, Excessive gas, Gets full quickly at meals, Hemorrhoids, Indigestion, Nausea, Rectal Pain and Vomiting. Female Genitourinary Not Present- Frequency, Nocturia, Painful  Urination, Pelvic Pain and Urgency. Musculoskeletal Not Present- Back Pain, Joint Pain, Joint Stiffness, Muscle Pain, Muscle Weakness and Swelling of Extremities. Neurological Not Present- Decreased Memory, Fainting, Headaches, Numbness, Seizures, Tingling, Tremor, Trouble walking and Weakness. Psychiatric Not Present- Anxiety, Bipolar, Change in Sleep Pattern, Depression, Fearful and Frequent crying. Endocrine Not Present- Cold Intolerance, Excessive Hunger, Hair Changes, Heat Intolerance, Hot flashes and New Diabetes. Hematology Not Present- Easy Bruising, Excessive bleeding, Gland problems, HIV and Persistent Infections.   Vitals Sherri Billings Dockery LPN; 6/64/4034 74:25 AM) 08/07/2014 10:21 AM Weight: 104.4 lb Height: 60in Body Surface Area: 1.42 m Body Mass Index: 20.39 kg/m Temp.: 98.28F(Oral)  Pulse: 77 (Regular)  BP: 118/76 (Sitting, Left Arm, Standard)    Physical Exam Sherri Regal MD; 08/07/2014 11:15 AM)  General - appears comfortable, no distress; not diaphorectic  HEENT - normocephalic; sclerae clear, gaze conjugate; mucous membranes moist, dentition good; voice normal  Neck - symmetric on extension; no palpable anterior or posterior cervical adenopathy; thyroid gland is firm and slightly nodular bilaterally without discrete or dominant mass  Chest - clear bilaterally without rhonchi, rales, or wheeze  Cor - regular rhythm with normal rate; no significant murmur  Ext - non-tender without significant edema or lymphedema  Neuro - grossly intact; no tremor    Assessment &  Plan  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  The dominant nodule on the right has atypia, Bethesda Category III.  I have recommended total thyroidectomy for definitive diagnosis and management.  The risks and benefits of the procedure have been discussed at length with the patient.  The patient understands the proposed procedure, potential alternative treatments, and the course of recovery to be expected.  All of the patient's questions have been answered at this time.  The patient wishes to proceed with surgery.  Sherri Regal, MD, [facility] Office: [phone]

## 2014-08-30 ENCOUNTER — Encounter (HOSPITAL_COMMUNITY): Payer: Self-pay | Admitting: Surgery

## 2014-08-30 DIAGNOSIS — E042 Nontoxic multinodular goiter: Secondary | ICD-10-CM | POA: Diagnosis not present

## 2014-08-30 LAB — BASIC METABOLIC PANEL
ANION GAP: 9 (ref 5–15)
BUN: 9 mg/dL (ref 6–20)
CO2: 27 mmol/L (ref 22–32)
CREATININE: 0.65 mg/dL (ref 0.44–1.00)
Calcium: 8.4 mg/dL — ABNORMAL LOW (ref 8.9–10.3)
Chloride: 104 mmol/L (ref 101–111)
GFR calc Af Amer: >60 mL/min (ref >60)
GFR calc non Af Amer: >60 mL/min (ref >60)
GLUCOSE: 128 mg/dL — AB (ref 65–99)
Potassium: 3.5 mmol/L (ref 3.5–5.1)
SODIUM: 140 mmol/L (ref 135–145)

## 2014-08-30 MED ORDER — SYNTHROID 88 MCG PO TABS: 88.0000 ug | ORAL_TABLET | Freq: Every day | ORAL | Status: DC

## 2014-08-30 MED ORDER — CALCIUM CARBONATE 1250 (500 CA) MG PO TABS: 2.0000 | ORAL_TABLET | Freq: Three times a day (TID) | ORAL | Status: DC

## 2014-08-30 MED ORDER — HYDROCODONE-ACETAMINOPHEN 5-325 MG PO TABS: 1.0000 | ORAL_TABLET | ORAL | Status: DC | PRN

## 2014-08-30 NOTE — Discharge Summary (Signed)
Physician Discharge Summary [facility]  Patient ID: Sherri Charles MRN: [id number] DOB/AGE: 1965/09/20 49 y.o.  Admit date: 08/29/2014 Discharge date: 08/30/2014  Admission Diagnoses:  Thyroid neoplasm of uncertain behavior  Discharge Diagnoses:  Principal Problem:   Neoplasm of uncertain behavior of thyroid gland   Discharged Condition: good  Hospital Course: Patient was admitted for observation following thyroid surgery.  Post op course was uncomplicated.  Pain was well controlled.  Tolerated diet.  Post op calcium level on morning following surgery was 8.4 mg/dl.  Patient was prepared for discharge home on POD#1.  Consults: None  Treatments: surgery: total thyroidectomy  Discharge Exam: Blood pressure 112/59, pulse 84, temperature 98.1 F (36.7 C), temperature source Oral, resp. rate 18, height 5' (1.524 m), weight 47.628 kg (105 lb), SpO2 100 %. HEENT - clear Neck - wound dry and intact, minimal STS; voice normal Chest - clear bilaterally Cor - RRR  Disposition: Home  Discharge Instructions    Diet - low sodium heart healthy    Complete by:  As directed      Discharge instructions    Complete by:  As directed   [facility]  THYROID & PARATHYROID SURGERY:  POST-OP INSTRUCTIONS  Always review your discharge instruction sheet from the facility where your surgery was performed.  A prescription for pain medication may be given to you upon discharge.  Take your pain medication as prescribed.  If narcotic pain medicine is not needed, then you may take acetaminophen (Tylenol) or ibuprofen (Advil) as needed.  Take your usually prescribed medications unless otherwise directed.  If you need a refill on your pain medication, please contact your pharmacy. They will contact our office to request authorization.  Prescriptions will not be processed by our office after 5 pm or on weekends.  Start with a light diet upon arrival home, such as soup  and crackers or toast.  Be sure to drink plenty of fluids daily.  Resume your normal diet the day after surgery.  Most patients will experience some swelling and bruising on the chest and neck area.  Ice packs will help.  Swelling and bruising can take several days to resolve.   It is common to experience some constipation after surgery.  Increasing fluid intake and taking a stool softener will usually help or prevent this problem.  A mild laxative (Milk of Magnesia or Miralax) should be taken according to package directions if there has been no bowel movement after 48 hours.  You have steri-strips and a gauze dressing over your incision.  You may remove the gauze bandage on the second day after surgery, and you may shower at that time.  Leave your steri-strips (small skin tapes) in place directly over the incision.  These strips should remain on the skin for 5-7 days and then be removed.  You may get them wet in the shower and pat them dry.  You may resume regular (light) daily activities beginning the next day - such as daily self-care, walking, climbing stairs - gradually increasing activities as tolerated.  You may have sexual intercourse when it is comfortable.  Refrain from any heavy lifting or straining until approved by your doctor.  You may drive when you no longer are taking prescription pain medication, you can comfortably wear a seatbelt, and you can safely maneuver your car and apply brakes.  You should see your doctor in the office for a follow-up appointment approximately two to three weeks after  your surgery.  Make sure that you call for this appointment within a day or two after you arrive home to insure a convenient appointment time.  WHEN TO CALL YOUR DOCTOR: -- Fever greater than 101.5 -- Inability to urinate -- Nausea and/or vomiting - persistent -- Extreme swelling or bruising -- Continued bleeding from incision -- Increased pain, redness, or drainage from the incision --  Difficulty swallowing or breathing -- Muscle cramping or spasms -- Numbness or tingling in hands or around lips  The clinic staff is available to answer your questions during regular business hours.  Please don't hesitate to call and ask to speak to one of the nurses if you have concerns.  [name], MD, [facility] Office: [phone]  Website: [email or web address]     Increase activity slowly    Complete by:  As directed      Remove dressing in 24 hours    Complete by:  As directed             Medication List    TAKE these medications        CALCIUM 600 + D PO  Take 600 mg by mouth 2 (two) times daily. In am and lunch time.     calcium carbonate 1250 (500 CA) MG tablet  Commonly known as:  OS-CAL - dosed in mg of elemental calcium  Take 2 tablets (1,000 mg of elemental calcium total) by mouth 3 (three) times daily with meals.     fish oil-omega-3 fatty acids 1000 MG capsule  Take 2,000 mg by mouth 2 (two) times daily. In am and lunchtime.     HYDROcodone-acetaminophen 5-325 MG per tablet  Commonly known as:  NORCO/VICODIN  Take 1-2 tablets by mouth every 4 (four) hours as needed for moderate pain.     ibuprofen 200 MG tablet  Commonly known as:  ADVIL,MOTRIN  Take 400 mg by mouth every 6 (six) hours as needed for headache or moderate pain.     multivitamin per tablet  Take 1 tablet by mouth every morning.     nystatin cream  Commonly known as:  MYCOSTATIN  Apply 1 application topically daily as needed for dry skin.     OVER THE COUNTER MEDICATION  Place 1-2 drops into both eyes daily as needed (dry eyes). [employer] brand eye drops for allergies.     pantoprazole 40 MG tablet  Commonly known as:  PROTONIX  Take 1 tablet (40 mg total) by mouth 2 (two) times daily.     ranitidine 150 MG tablet  Commonly known as:  ZANTAC  Take 1 tablet (150 mg total) by mouth 2 (two) times daily.      sucralfate 1 GM/10ML suspension  Commonly known as:  CARAFATE  Take 10 mLs (1 g total) by mouth 3 (three) times daily as needed.     SYNTHROID 88 MCG tablet  Generic drug:  levothyroxine  Take 1 tablet (88 mcg total) by mouth daily before breakfast.         [name], MD, [facility] Office: [phone]   Signed: [name] 08/30/2014, 7:27 AM

## 2014-08-30 NOTE — Anesthesia Postprocedure Evaluation (Signed)
  Anesthesia Post-op Note  Patient: Sherri Charles  Procedure(s) Performed: Procedure(s) (LRB): TOTAL THYROIDECTOMY (N/A)  Patient Location: PACU  Anesthesia Type: General  Level of Consciousness: awake and alert   Airway and Oxygen Therapy: Patient Spontanous Breathing  Post-op Pain: mild  Post-op Assessment: Post-op Vital signs reviewed, Patient's Cardiovascular Status Stable, Respiratory Function Stable, Patent Airway and No signs of Nausea or vomiting  Last Vitals:  Filed Vitals:   08/30/14 0500  BP: 112/59  Pulse: 84  Temp: 36.7 C  Resp: 18    Post-op Vital Signs: stable   Complications: No apparent anesthesia complications

## 2014-09-02 NOTE — Progress Notes (Signed)
Quick Note:  Please contact patient and notify of benign pathology results.  Maylen Waltermire M. Demetries Coia, MD, FACS [facility] Office: [phone]   ______ 

## 2014-09-09 ENCOUNTER — Other Ambulatory Visit: Payer: Self-pay | Admitting: Gynecology

## 2014-09-10 LAB — CYTOLOGY - PAP

## 2014-10-08 ENCOUNTER — Ambulatory Visit
Admission: RE | Admit: 2014-10-08 | Discharge: 2014-10-08 | Disposition: A | Discharge status: Home / Self Care | Payer: BLUE CROSS/BLUE SHIELD | Source: Ambulatory Visit

## 2014-10-08 DIAGNOSIS — Z1231 Encounter for screening mammogram for malignant neoplasm of breast: Secondary | ICD-10-CM

## 2014-10-09 ENCOUNTER — Other Ambulatory Visit: Payer: Self-pay | Admitting: Gynecology

## 2014-10-09 DIAGNOSIS — R928 Other abnormal and inconclusive findings on diagnostic imaging of breast: Secondary | ICD-10-CM

## 2014-10-15 ENCOUNTER — Ambulatory Visit
Admission: RE | Admit: 2014-10-15 | Discharge: 2014-10-15 | Disposition: A | Discharge status: Home / Self Care | Payer: BLUE CROSS/BLUE SHIELD | Source: Ambulatory Visit | Attending: Gynecology | Admitting: Gynecology

## 2014-10-15 DIAGNOSIS — R928 Other abnormal and inconclusive findings on diagnostic imaging of breast: Secondary | ICD-10-CM

## 2014-10-30 ENCOUNTER — Encounter: Payer: Self-pay | Admitting: Gastroenterology

## 2014-10-30 ENCOUNTER — Ambulatory Visit (INDEPENDENT_AMBULATORY_CARE_PROVIDER_SITE_OTHER): Payer: BLUE CROSS/BLUE SHIELD | Admitting: Gastroenterology

## 2014-10-30 VITALS — BP 104/62 | HR 68 | Ht 59.75 in | Wt 105.4 lb

## 2014-10-30 DIAGNOSIS — K219 Gastro-esophageal reflux disease without esophagitis: Secondary | ICD-10-CM | POA: Diagnosis not present

## 2014-10-30 NOTE — Progress Notes (Signed)
    History of Present Illness: This is a 49 year old female with a history of GERD. She underwent EGD in 03/2014 which showed a small hiatal hernia but was otherwise unremarkable. An empiric dilation was performed for dysphagia without a stricture noted. Her reflux symptoms and dysphagia have been under excellent control on pantoprazole 40 mg twice daily and ranitidine 300 mg at bedtime. She is wondering if she can decrease her medications.  Current Medications, Allergies, Past Medical History, Past Surgical History, Family History and Social History were reviewed in [employer] record.  Physical Exam: General: Well developed , well nourished, no acute distress Head: Normocephalic and atraumatic Eyes:  sclerae anicteric, EOMI Ears: Normal auditory acuity Mouth: No deformity or lesions Lungs: Clear throughout to auscultation Heart: Regular rate and rhythm; no murmurs, rubs or bruits Abdomen: Soft, non tender and non distended. No masses, hepatosplenomegaly or hernias noted. Normal Bowel sounds Musculoskeletal: Symmetrical with no gross deformities  Pulses:  Normal pulses noted Extremities: No clubbing, cyanosis, edema or deformities noted Neurological: Alert oriented x 4, grossly nonfocal Psychological:  Alert and cooperative. Normal mood and affect  Assessment and Recommendations:  1. GERD. Well controlled. Discontinue Zantac at bedtime and if her symptoms remain under control for one month may discontinue the evening dose of pantoprazole. I would like her to remain on standard antireflux measures and pantoprazole 40 mg every morning. She may resume prior therapy if her symptoms cannot be controlled with above reductions in therapy.  2. CRC screening, average risk. 10 year interval colonoscopy due March 2021.

## 2014-10-30 NOTE — Patient Instructions (Signed)
Stop taking your Zantac at night. If your symptoms do not return then you can stop Protonix at night and just take take your morning dose.   Thank you for choosing me and [facility].  Pricilla Riffle. Dagoberto Ligas., MD., Marval Regal

## 2014-10-31 ENCOUNTER — Other Ambulatory Visit (INDEPENDENT_AMBULATORY_CARE_PROVIDER_SITE_OTHER): Payer: BLUE CROSS/BLUE SHIELD

## 2014-10-31 DIAGNOSIS — Z0000 Encounter for general adult medical examination without abnormal findings: Secondary | ICD-10-CM | POA: Diagnosis not present

## 2014-10-31 LAB — LIPID PANEL
Cholesterol: 233 mg/dL — ABNORMAL HIGH (ref 0–200)
HDL: 98.2 mg/dL (ref >39.00)
LDL Cholesterol: 120 mg/dL — ABNORMAL HIGH (ref 0–99)
NonHDL: 135.25
Total CHOL/HDL Ratio: 2
Triglycerides: 74 mg/dL (ref 0.0–149.0)
VLDL: 14.8 mg/dL (ref 0.0–40.0)

## 2014-10-31 LAB — CBC WITH DIFFERENTIAL/PLATELET
BASOS ABS: 0 10*3/uL (ref 0.0–0.1)
Basophils Relative: 0.8 % (ref 0.0–3.0)
EOS ABS: 0.1 10*3/uL (ref 0.0–0.7)
Eosinophils Relative: 1.8 % (ref 0.0–5.0)
HCT: 40.8 % (ref 36.0–46.0)
HEMOGLOBIN: 13.8 g/dL (ref 12.0–15.0)
LYMPHS ABS: 1.2 10*3/uL (ref 0.7–4.0)
Lymphocytes Relative: 32 % (ref 12.0–46.0)
MCHC: 33.8 g/dL (ref 30.0–36.0)
MCV: 90.6 fl (ref 78.0–100.0)
MONO ABS: 0.2 10*3/uL (ref 0.1–1.0)
Monocytes Relative: 5.9 % (ref 3.0–12.0)
NEUTROS ABS: 2.2 10*3/uL (ref 1.4–7.7)
Neutrophils Relative %: 59.5 % (ref 43.0–77.0)
Platelets: 241 10*3/uL (ref 150.0–400.0)
RBC: 4.51 Mil/uL (ref 3.87–5.11)
RDW: 14.5 % (ref 11.5–15.5)
WBC: 3.8 10*3/uL — AB (ref 4.0–10.5)

## 2014-10-31 LAB — HEPATIC FUNCTION PANEL
ALT: 19 U/L (ref 0–35)
AST: 23 U/L (ref 0–37)
Albumin: 4.5 g/dL (ref 3.5–5.2)
Alkaline Phosphatase: 67 U/L (ref 39–117)
BILIRUBIN DIRECT: 0.1 mg/dL (ref 0.0–0.3)
BILIRUBIN TOTAL: 0.6 mg/dL (ref 0.2–1.2)
Total Protein: 7.5 g/dL (ref 6.0–8.3)

## 2014-10-31 LAB — POCT URINALYSIS DIPSTICK
BILIRUBIN UA: NEGATIVE
Glucose, UA: NEGATIVE
KETONES UA: NEGATIVE
LEUKOCYTES UA: NEGATIVE
Nitrite, UA: NEGATIVE
Protein, UA: NEGATIVE
RBC UA: NEGATIVE
SPEC GRAV UA: 1.01
Urobilinogen, UA: 0.2
pH, UA: 7

## 2014-10-31 LAB — BASIC METABOLIC PANEL
BUN: 14 mg/dL (ref 6–23)
CALCIUM: 10.5 mg/dL (ref 8.4–10.5)
CO2: 31 meq/L (ref 19–32)
CREATININE: 0.88 mg/dL (ref 0.40–1.20)
Chloride: 102 mEq/L (ref 96–112)
GFR: 72.36 mL/min (ref >60.00)
GLUCOSE: 94 mg/dL (ref 70–99)
Potassium: 4.3 mEq/L (ref 3.5–5.1)
Sodium: 140 mEq/L (ref 135–145)

## 2014-10-31 LAB — TSH: TSH: 0.21 u[IU]/mL — ABNORMAL LOW (ref 0.35–4.50)

## 2014-11-07 ENCOUNTER — Encounter: Payer: BLUE CROSS/BLUE SHIELD | Admitting: Family Medicine

## 2014-11-27 ENCOUNTER — Ambulatory Visit (INDEPENDENT_AMBULATORY_CARE_PROVIDER_SITE_OTHER): Payer: BLUE CROSS/BLUE SHIELD | Admitting: Family Medicine

## 2014-11-27 ENCOUNTER — Encounter: Payer: Self-pay | Admitting: Family Medicine

## 2014-11-27 VITALS — BP 114/72 | HR 79 | Temp 98.2°F | Ht 59.75 in | Wt 105.0 lb

## 2014-11-27 DIAGNOSIS — E042 Nontoxic multinodular goiter: Secondary | ICD-10-CM | POA: Diagnosis not present

## 2014-11-27 DIAGNOSIS — Z0000 Encounter for general adult medical examination without abnormal findings: Secondary | ICD-10-CM | POA: Diagnosis not present

## 2014-11-27 MED ORDER — LEVOTHYROXINE SODIUM 75 MCG PO TABS: 75.0000 ug | ORAL_TABLET | Freq: Every day | ORAL | Status: DC

## 2014-11-27 NOTE — Progress Notes (Signed)
Pre visit review using our clinic review tool, if applicable. No additional management support is needed unless otherwise documented below in the visit note. Pt will get flu vaccine at work. 

## 2014-11-27 NOTE — Progress Notes (Signed)
   Subjective:    Patient ID: Sherri Charles, female    DOB: 21-Jun-1965, 49 y.o.   MRN: [id number]  HPI 49 yr old female for a well exam. She feels fine and as no concerns. She has been prescribed Synthroid by Dr. Harlow Asa for a few years but now he has turned her thyroid management over to [place]. He weight has been stable. Her recent TSH was a bit suppressed at 0.21. Her GERD is well controlled.    Review of Systems  Constitutional: Negative.   HENT: Negative.   Eyes: Negative.   Respiratory: Negative.   Cardiovascular: Negative.   Gastrointestinal: Negative.   Genitourinary: Negative for dysuria, urgency, frequency, hematuria, flank pain, decreased urine volume, enuresis, difficulty urinating, pelvic pain and dyspareunia.  Musculoskeletal: Negative.   Skin: Negative.   Neurological: Negative.   Psychiatric/Behavioral: Negative.        Objective:   Physical Exam  Constitutional: She is oriented to person, place, and time. She appears well-developed and well-nourished. No distress.  HENT:  Head: Normocephalic and atraumatic.  Right Ear: External ear normal.  Left Ear: External ear normal.  Nose: Nose normal.  Mouth/Throat: Oropharynx is clear and moist. No oropharyngeal exudate.  Eyes: Conjunctivae and EOM are normal. Pupils are equal, round, and reactive to light. No scleral icterus.  Neck: Normal range of motion. Neck supple. No JVD present. No thyromegaly present.  Cardiovascular: Normal rate, regular rhythm, normal heart sounds and intact distal pulses.  Exam reveals no gallop and no friction rub.   No murmur heard. Pulmonary/Chest: Effort normal and breath sounds normal. No respiratory distress. She has no wheezes. She has no rales. She exhibits no tenderness.  Abdominal: Soft. Bowel sounds are normal. She exhibits no distension and no mass. There is no tenderness. There is no rebound and no guarding.  Musculoskeletal: Normal range of motion. She exhibits no edema or tenderness.    Lymphadenopathy:    She has no cervical adenopathy.  Neurological: She is alert and oriented to person, place, and time. She has normal reflexes. No cranial nerve deficit. She exhibits normal muscle tone. Coordination normal.  Skin: Skin is warm and dry. No rash noted. No erythema.  Psychiatric: She has a normal mood and affect. Her behavior is normal. Judgment and thought content normal.          Assessment & Plan:  Well exam. We discussed diet and exercise advice. We will decrease her Synthroid to 75 mcg daily. Recheck a TSH in 90 days.

## 2015-02-26 ENCOUNTER — Other Ambulatory Visit (INDEPENDENT_AMBULATORY_CARE_PROVIDER_SITE_OTHER): Payer: BLUE CROSS/BLUE SHIELD

## 2015-02-26 DIAGNOSIS — E042 Nontoxic multinodular goiter: Secondary | ICD-10-CM

## 2015-02-26 LAB — TSH: TSH: 0.58 u[IU]/mL (ref 0.35–4.50)

## 2015-04-01 ENCOUNTER — Encounter: Payer: Self-pay | Admitting: Family Medicine

## 2015-04-01 ENCOUNTER — Ambulatory Visit (INDEPENDENT_AMBULATORY_CARE_PROVIDER_SITE_OTHER): Payer: BLUE CROSS/BLUE SHIELD | Admitting: Family Medicine

## 2015-04-01 VITALS — BP 128/70 | Temp 99.3°F | Ht 59.75 in | Wt 103.0 lb

## 2015-04-01 DIAGNOSIS — J019 Acute sinusitis, unspecified: Secondary | ICD-10-CM

## 2015-04-01 MED ORDER — LEVOFLOXACIN 500 MG PO TABS: 500.0000 mg | ORAL_TABLET | Freq: Every day | ORAL | Status: DC

## 2015-04-01 NOTE — Progress Notes (Signed)
Pre visit review using our clinic review tool, if applicable. No additional management support is needed unless otherwise documented below in the visit note. 

## 2015-04-01 NOTE — Progress Notes (Signed)
   Subjective:    Patient ID: Sherri Charles, female    DOB: 1965/04/06, 50 y.o.   MRN: [id number]  HPI Ere for 3 weeks of sinus pressure, PND, fever and a dry cough. Using Dayquil.    Review of Systems  Constitutional: Negative.   HENT: Positive for congestion, postnasal drip and sinus pressure. Negative for ear pain and sore throat.   Eyes: Negative.   Respiratory: Positive for cough.        Objective:   Physical Exam  Constitutional: She appears well-developed and well-nourished.  HENT:  Right Ear: External ear normal.  Left Ear: External ear normal.  Nose: Nose normal.  Mouth/Throat: Oropharynx is clear and moist.  Eyes: Conjunctivae are normal.  Neck: No thyromegaly present.  Pulmonary/Chest: Effort normal and breath sounds normal.  Lymphadenopathy:    She has no cervical adenopathy.          Assessment & Plan:  Sinusitis, treat with Levaquin and Mucinex D.

## 2015-07-06 ENCOUNTER — Other Ambulatory Visit: Payer: Self-pay | Admitting: Gastroenterology

## 2015-09-12 ENCOUNTER — Other Ambulatory Visit: Payer: Self-pay | Admitting: Gastroenterology

## 2015-09-23 ENCOUNTER — Telehealth: Payer: Self-pay | Admitting: Family Medicine

## 2015-09-23 NOTE — Telephone Encounter (Signed)
Pt requesting a refill on Protonix, normally she gets this medication from Dr. Fuller Plan, however she will run out before her scheduled appointment.

## 2015-09-25 NOTE — Telephone Encounter (Signed)
Call in #[phone] with no rf

## 2015-09-26 MED ORDER — PANTOPRAZOLE SODIUM 40 MG PO TBEC: 40.0000 mg | DELAYED_RELEASE_TABLET | Freq: Two times a day (BID) | ORAL | Status: DC

## 2015-09-26 NOTE — Telephone Encounter (Signed)
Refill sent to pharmacy.   

## 2015-10-28 ENCOUNTER — Other Ambulatory Visit: Payer: Self-pay | Admitting: Obstetrics and Gynecology

## 2015-10-28 DIAGNOSIS — Z1231 Encounter for screening mammogram for malignant neoplasm of breast: Secondary | ICD-10-CM

## 2015-10-29 ENCOUNTER — Other Ambulatory Visit: Payer: Self-pay | Admitting: Family Medicine

## 2015-11-10 ENCOUNTER — Telehealth: Payer: Self-pay | Admitting: Family Medicine

## 2015-11-10 NOTE — Telephone Encounter (Signed)
Pt request generic synthroid and a 90 day supply to [facility] [place].

## 2015-11-13 MED ORDER — LEVOTHYROXINE SODIUM 75 MCG PO TABS: 75.0000 ug | ORAL_TABLET | Freq: Every day | ORAL | 0 refills | Status: DC

## 2015-11-13 NOTE — Telephone Encounter (Signed)
Per Dr. Sarajane Jews okay to give generic form of Synthroid.

## 2015-11-13 NOTE — Telephone Encounter (Signed)
Rx sent 

## 2015-11-20 ENCOUNTER — Ambulatory Visit
Admission: RE | Admit: 2015-11-20 | Discharge: 2015-11-20 | Disposition: A | Discharge status: Home / Self Care | Payer: 59 | Source: Ambulatory Visit | Attending: Obstetrics and Gynecology | Admitting: Obstetrics and Gynecology

## 2015-11-20 DIAGNOSIS — Z1231 Encounter for screening mammogram for malignant neoplasm of breast: Secondary | ICD-10-CM

## 2015-11-25 ENCOUNTER — Other Ambulatory Visit: Payer: Self-pay | Admitting: Obstetrics and Gynecology

## 2015-11-25 DIAGNOSIS — R928 Other abnormal and inconclusive findings on diagnostic imaging of breast: Secondary | ICD-10-CM

## 2015-11-26 ENCOUNTER — Other Ambulatory Visit: Payer: BLUE CROSS/BLUE SHIELD

## 2015-11-28 ENCOUNTER — Ambulatory Visit
Admission: RE | Admit: 2015-11-28 | Discharge: 2015-11-28 | Disposition: A | Discharge status: Home / Self Care | Payer: 59 | Source: Ambulatory Visit | Attending: Obstetrics and Gynecology | Admitting: Obstetrics and Gynecology

## 2015-11-28 DIAGNOSIS — R928 Other abnormal and inconclusive findings on diagnostic imaging of breast: Secondary | ICD-10-CM

## 2015-12-02 ENCOUNTER — Encounter: Payer: BLUE CROSS/BLUE SHIELD | Admitting: Family Medicine

## 2015-12-05 ENCOUNTER — Telehealth: Payer: Self-pay | Admitting: Family Medicine

## 2015-12-05 DIAGNOSIS — N951 Menopausal and female climacteric states: Secondary | ICD-10-CM

## 2015-12-05 NOTE — Telephone Encounter (Signed)
I spoke with pt and gave below information.  

## 2015-12-05 NOTE — Telephone Encounter (Signed)
Pt has upcoming lab appointment here at our office, she is requesting to add on [facility], per OBGYN. Can we do this and if so then what is the diagnosis?

## 2015-12-05 NOTE — Telephone Encounter (Signed)
I already added these orders

## 2015-12-10 ENCOUNTER — Other Ambulatory Visit (INDEPENDENT_AMBULATORY_CARE_PROVIDER_SITE_OTHER): Payer: 59

## 2015-12-10 DIAGNOSIS — N951 Menopausal and female climacteric states: Secondary | ICD-10-CM | POA: Diagnosis not present

## 2015-12-10 DIAGNOSIS — Z0000 Encounter for general adult medical examination without abnormal findings: Secondary | ICD-10-CM

## 2015-12-10 LAB — HEPATIC FUNCTION PANEL
ALT: 15 U/L (ref 0–35)
AST: 17 U/L (ref 0–37)
Albumin: 4.5 g/dL (ref 3.5–5.2)
Alkaline Phosphatase: 65 U/L (ref 39–117)
BILIRUBIN DIRECT: 0.1 mg/dL (ref 0.0–0.3)
BILIRUBIN TOTAL: 0.6 mg/dL (ref 0.2–1.2)
TOTAL PROTEIN: 7.2 g/dL (ref 6.0–8.3)

## 2015-12-10 LAB — LIPID PANEL
CHOLESTEROL: 235 mg/dL — AB (ref 0–200)
HDL: 95.1 mg/dL (ref >39.00)
LDL CALC: 127 mg/dL — AB (ref 0–99)
NonHDL: 139.75
TRIGLYCERIDES: 64 mg/dL (ref 0.0–149.0)
Total CHOL/HDL Ratio: 2
VLDL: 12.8 mg/dL (ref 0.0–40.0)

## 2015-12-10 LAB — CBC WITH DIFFERENTIAL/PLATELET
Basophils Absolute: 0 10*3/uL (ref 0.0–0.1)
Basophils Relative: 0.5 % (ref 0.0–3.0)
EOS ABS: 0 10*3/uL (ref 0.0–0.7)
Eosinophils Relative: 0.7 % (ref 0.0–5.0)
HCT: 38.7 % (ref 36.0–46.0)
Hemoglobin: 13.3 g/dL (ref 12.0–15.0)
Lymphocytes Relative: 23.5 % (ref 12.0–46.0)
Lymphs Abs: 1.1 10*3/uL (ref 0.7–4.0)
MCHC: 34.3 g/dL (ref 30.0–36.0)
MCV: 90.4 fl (ref 78.0–100.0)
MONO ABS: 0.2 10*3/uL (ref 0.1–1.0)
Monocytes Relative: 4 % (ref 3.0–12.0)
Neutro Abs: 3.4 10*3/uL (ref 1.4–7.7)
Neutrophils Relative %: 71.3 % (ref 43.0–77.0)
Platelets: 230 10*3/uL (ref 150.0–400.0)
RBC: 4.28 Mil/uL (ref 3.87–5.11)
RDW: 14.2 % (ref 11.5–15.5)
WBC: 4.8 10*3/uL (ref 4.0–10.5)

## 2015-12-10 LAB — BASIC METABOLIC PANEL
BUN: 15 mg/dL (ref 6–23)
CO2: 32 meq/L (ref 19–32)
CREATININE: 0.84 mg/dL (ref 0.40–1.20)
Calcium: 9.8 mg/dL (ref 8.4–10.5)
Chloride: 101 mEq/L (ref 96–112)
GFR: 76.01 mL/min (ref >60.00)
GLUCOSE: 79 mg/dL (ref 70–99)
Potassium: 3.7 mEq/L (ref 3.5–5.1)
Sodium: 141 mEq/L (ref 135–145)

## 2015-12-10 LAB — POC URINALSYSI DIPSTICK (AUTOMATED)
Bilirubin, UA: NEGATIVE
Blood, UA: NEGATIVE
GLUCOSE UA: NEGATIVE
Ketones, UA: NEGATIVE
LEUKOCYTES UA: NEGATIVE
Nitrite, UA: NEGATIVE
PROTEIN UA: NEGATIVE
SPEC GRAV UA: 1.01
UROBILINOGEN UA: 0.2
pH, UA: 6

## 2015-12-10 LAB — TSH: TSH: 0.95 u[IU]/mL (ref 0.35–4.50)

## 2015-12-10 LAB — LUTEINIZING HORMONE: LH: 52.65 m[IU]/mL

## 2015-12-10 LAB — FOLLICLE STIMULATING HORMONE: FSH: 115.5 m[IU]/mL

## 2015-12-12 ENCOUNTER — Telehealth: Payer: Self-pay | Admitting: Family Medicine

## 2015-12-12 MED ORDER — MALATHION 0.5 % EX LOTN: TOPICAL_LOTION | Freq: Once | CUTANEOUS | 1 refills | Status: AC

## 2015-12-12 NOTE — Telephone Encounter (Signed)
Script was faxed to [facility] [place] and I spoke with pt.

## 2015-12-12 NOTE — Telephone Encounter (Signed)
Call in Ovide lotion #2 bottles, to apply one tonight and use the other bottle in 7 days

## 2015-12-12 NOTE — Telephone Encounter (Signed)
Pt requesting a script for head lice, her daughter was recently treated and now pt thinks she has it.

## 2015-12-17 ENCOUNTER — Ambulatory Visit (INDEPENDENT_AMBULATORY_CARE_PROVIDER_SITE_OTHER): Payer: 59 | Admitting: Family Medicine

## 2015-12-17 ENCOUNTER — Encounter: Payer: Self-pay | Admitting: Family Medicine

## 2015-12-17 VITALS — BP 139/83 | HR 79 | Temp 98.3°F | Ht 59.75 in | Wt 104.0 lb

## 2015-12-17 DIAGNOSIS — Z23 Encounter for immunization: Secondary | ICD-10-CM

## 2015-12-17 DIAGNOSIS — Z0000 Encounter for general adult medical examination without abnormal findings: Secondary | ICD-10-CM

## 2015-12-17 MED ORDER — LEVOTHYROXINE SODIUM 75 MCG PO TABS: 75.0000 ug | ORAL_TABLET | Freq: Every day | ORAL | 3 refills | Status: DC

## 2015-12-17 MED ORDER — RANITIDINE HCL 150 MG PO TABS: ORAL_TABLET | ORAL | 3 refills | Status: DC

## 2015-12-17 MED ORDER — PANTOPRAZOLE SODIUM 40 MG PO TBEC: 40.0000 mg | DELAYED_RELEASE_TABLET | Freq: Two times a day (BID) | ORAL | 3 refills | Status: DC

## 2015-12-17 NOTE — Progress Notes (Signed)
   Subjective:    Patient ID: Sherri Charles, female    DOB: [birth year], 50 y.o.   MRN: [id number]  HPI 50 yr old female for a well exam. She feels well except for one issue. She has started exercising by walking 3 days a week and now she has some intermittent pain over the left hip area.    Review of Systems  Constitutional: Negative.   HENT: Negative.   Eyes: Negative.   Respiratory: Negative.   Cardiovascular: Negative.   Gastrointestinal: Negative.   Genitourinary: Negative for decreased urine volume, difficulty urinating, dyspareunia, dysuria, enuresis, flank pain, frequency, hematuria, pelvic pain and urgency.  Musculoskeletal: Negative.   Skin: Negative.   Neurological: Negative.   Psychiatric/Behavioral: Negative.        Objective:   Physical Exam  Constitutional: She is oriented to person, place, and time. She appears well-developed and well-nourished. No distress.  HENT:  Head: Normocephalic and atraumatic.  Right Ear: External ear normal.  Left Ear: External ear normal.  Nose: Nose normal.  Mouth/Throat: Oropharynx is clear and moist. No oropharyngeal exudate.  Eyes: Conjunctivae and EOM are normal. Pupils are equal, round, and reactive to light. No scleral icterus.  Neck: Normal range of motion. Neck supple. No JVD present. No thyromegaly present.  Cardiovascular: Normal rate, regular rhythm, normal heart sounds and intact distal pulses.  Exam reveals no gallop and no friction rub.   No murmur heard. EKG normal   Pulmonary/Chest: Effort normal and breath sounds normal. No respiratory distress. She has no wheezes. She has no rales. She exhibits no tenderness.  Abdominal: Soft. Bowel sounds are normal. She exhibits no distension and no mass. There is no tenderness. There is no rebound and no guarding.  Musculoskeletal: Normal range of motion. She exhibits no edema.  Tender over the left greater trochanter   Lymphadenopathy:    She has no cervical adenopathy.    Neurological: She is alert and oriented to person, place, and time. She has normal reflexes. No cranial nerve deficit. She exhibits normal muscle tone. Coordination normal.  Skin: Skin is warm and dry. No rash noted. No erythema.  Psychiatric: She has a normal mood and affect. Her behavior is normal. Judgment and thought content normal.          Assessment & Plan:  Well exam. We discussed diet and exercise. She has some trochanteric bursitis, so I advised her to use ice packs and Ibuprofen as needed. She may want ot try a different form of exercise like rising a bike or swimming.  Laurey Morale, MD

## 2015-12-17 NOTE — Progress Notes (Signed)
Pre visit review using our clinic review tool, if applicable. No additional management support is needed unless otherwise documented below in the visit note. 

## 2015-12-22 ENCOUNTER — Other Ambulatory Visit: Payer: Self-pay | Admitting: Family Medicine

## 2016-01-31 IMAGING — CR DG FOOT COMPLETE 3+V*R*
3 series · 3 of 3 positions shown · non-contrast
Comparison: None.

CLINICAL DATA: 47-year-old female status post fall with pain and
swelling at the lateral aspect. Initial encounter.

EXAM:
RIGHT FOOT COMPLETE - 3+ VIEW

[view not recorded (1 of 3)]
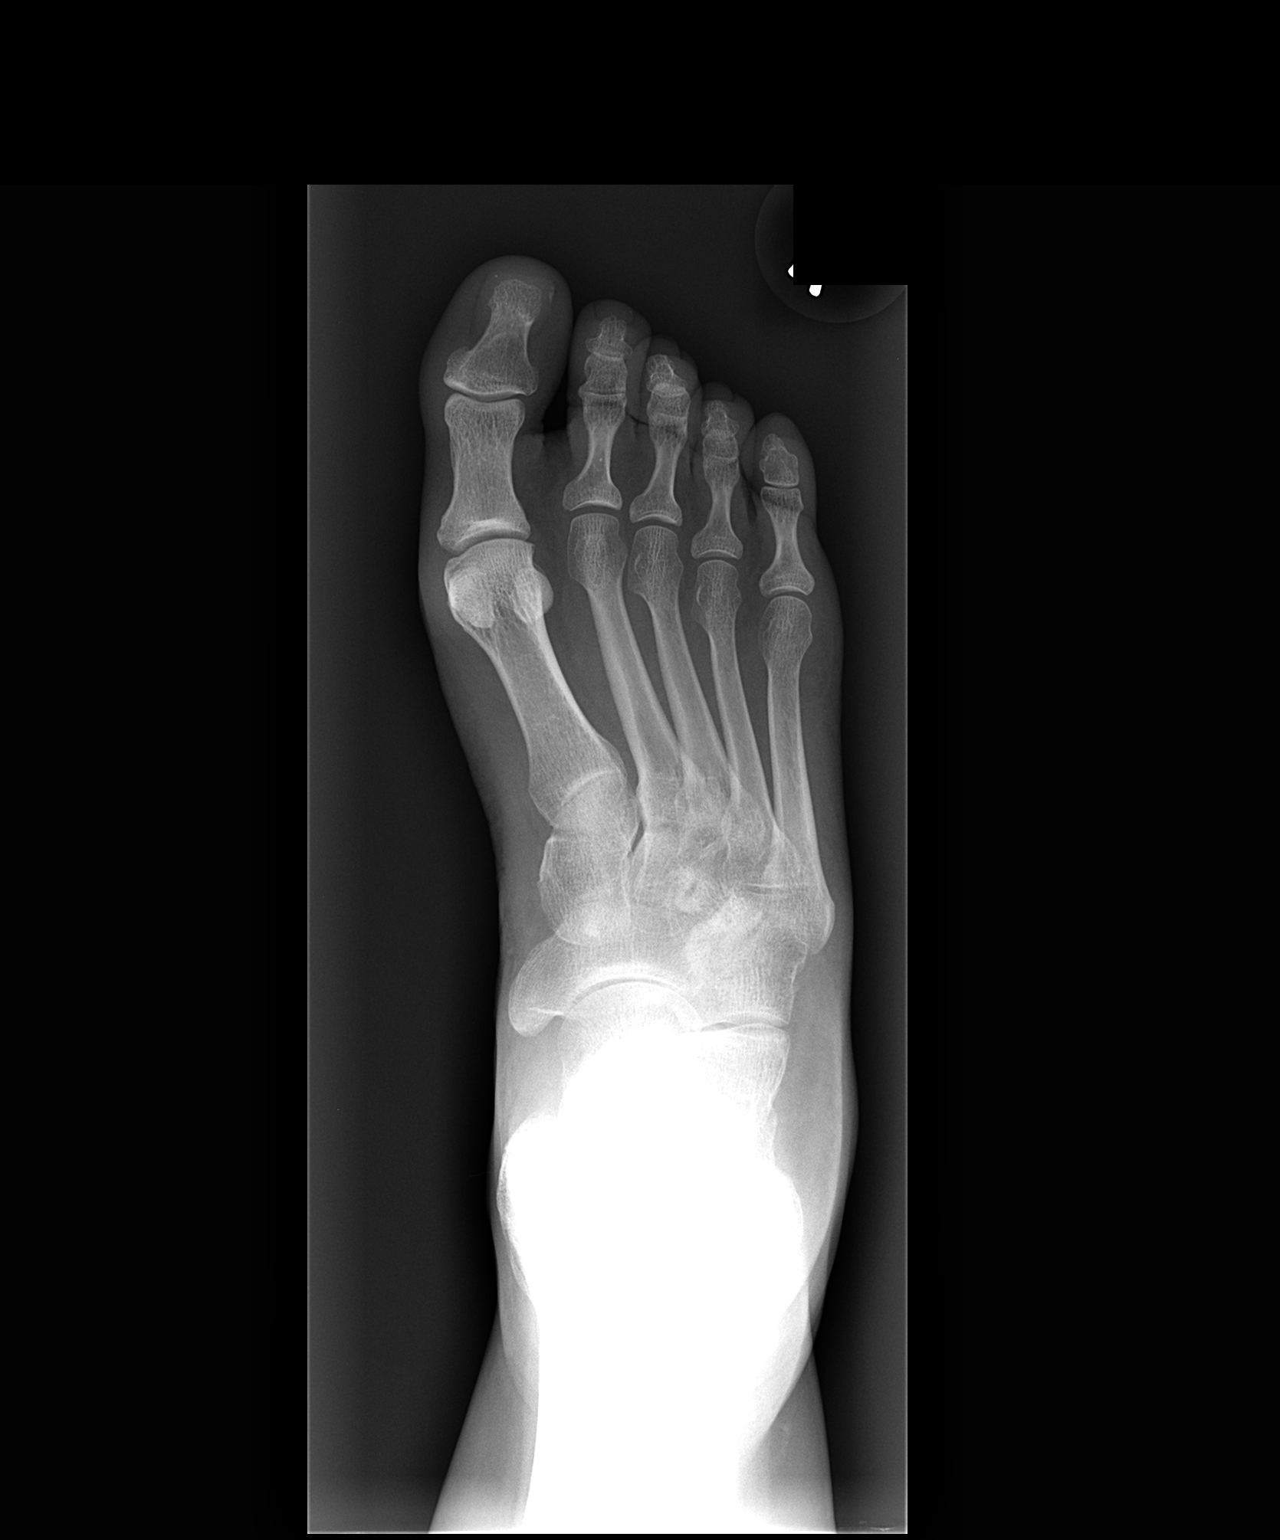

[view not recorded (2 of 3)]
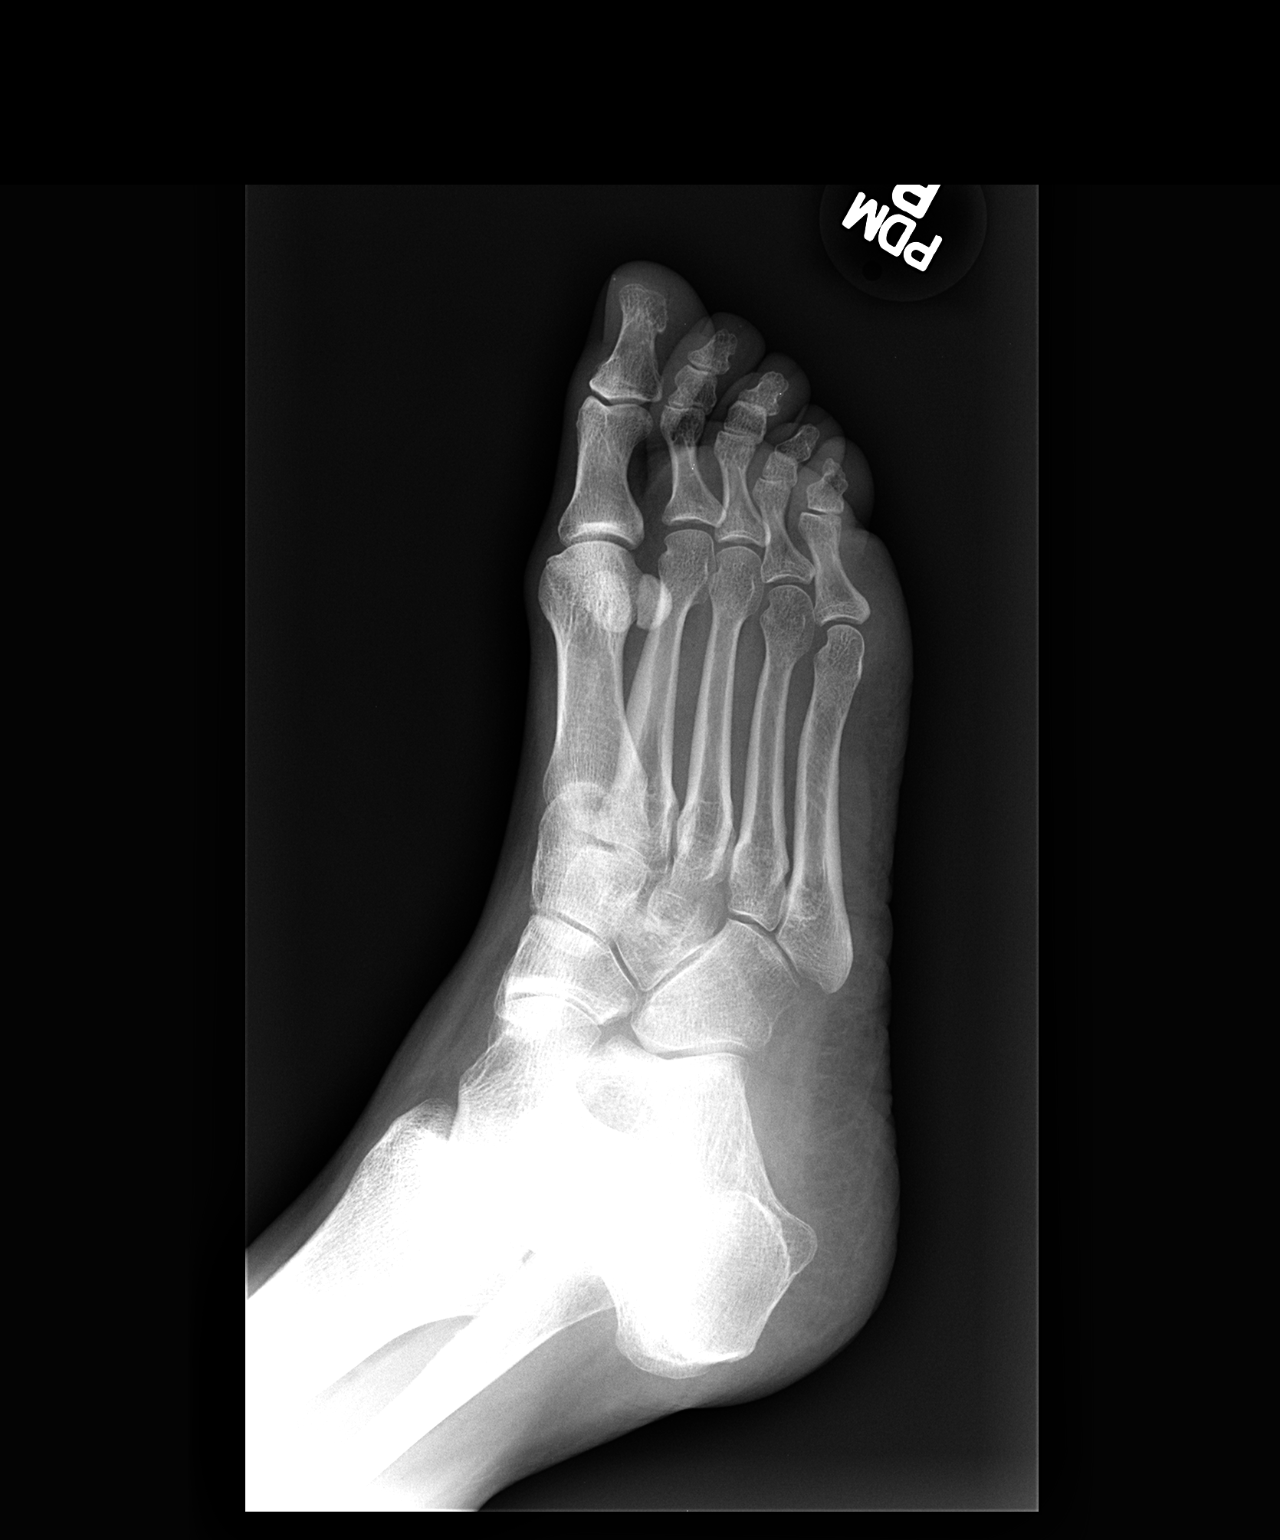

[view not recorded (3 of 3)]
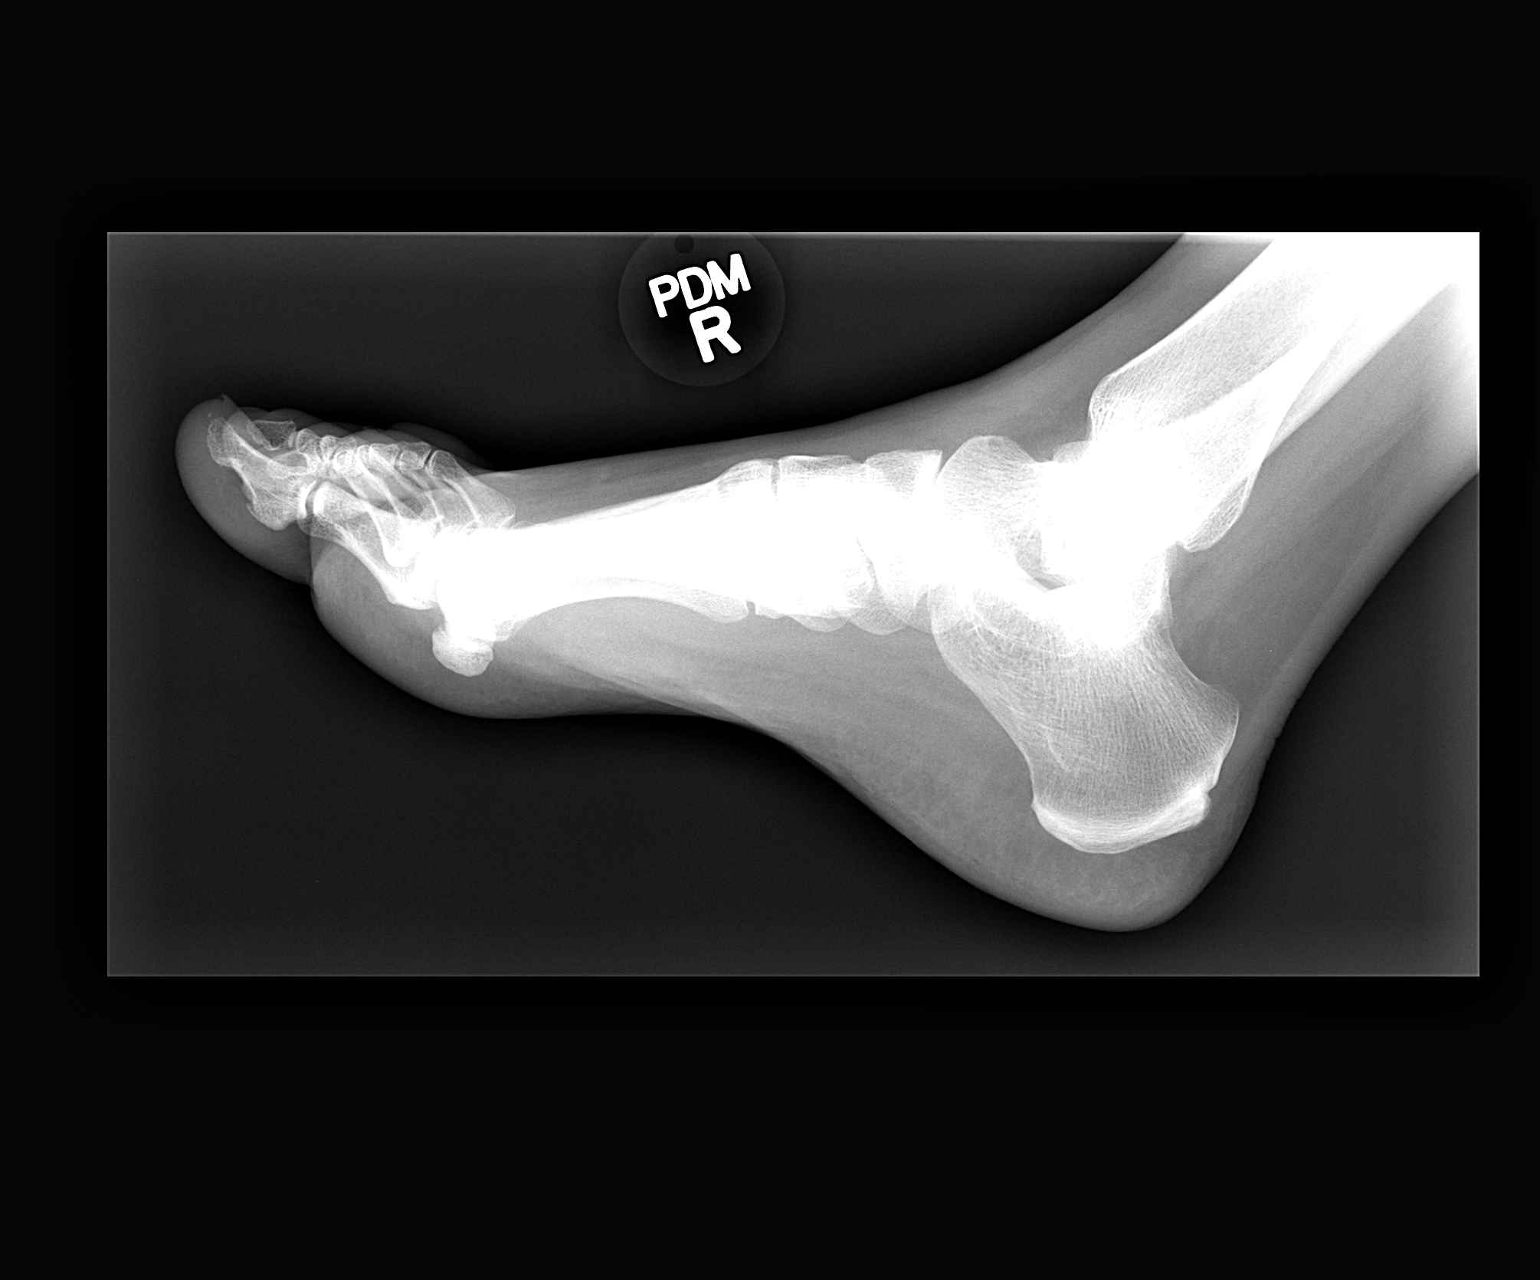

[3 of 3 positions shown; findings below may reference images not displayed]

FINDINGS: Bone mineralization is within normal limits. Calcaneus intact. Joint
spaces and alignment within normal limits. No fracture or
dislocation identified.
IMPRESSION: No acute fracture or dislocation identified about the right foot.

## 2016-06-29 DIAGNOSIS — S60221A Contusion of right hand, initial encounter: Secondary | ICD-10-CM | POA: Diagnosis not present

## 2016-07-19 DIAGNOSIS — H04123 Dry eye syndrome of bilateral lacrimal glands: Secondary | ICD-10-CM | POA: Diagnosis not present

## 2016-09-22 ENCOUNTER — Other Ambulatory Visit: Payer: Self-pay | Admitting: Obstetrics and Gynecology

## 2016-09-22 DIAGNOSIS — Z1231 Encounter for screening mammogram for malignant neoplasm of breast: Secondary | ICD-10-CM

## 2016-10-04 ENCOUNTER — Ambulatory Visit (INDEPENDENT_AMBULATORY_CARE_PROVIDER_SITE_OTHER): Payer: 59 | Admitting: Family Medicine

## 2016-10-04 ENCOUNTER — Encounter: Payer: Self-pay | Admitting: Family Medicine

## 2016-10-04 VITALS — BP 110/60 | Temp 98.3°F | Ht 59.75 in | Wt 90.0 lb

## 2016-10-04 DIAGNOSIS — G4700 Insomnia, unspecified: Secondary | ICD-10-CM | POA: Diagnosis not present

## 2016-10-04 DIAGNOSIS — R634 Abnormal weight loss: Secondary | ICD-10-CM | POA: Diagnosis not present

## 2016-10-04 DIAGNOSIS — E042 Nontoxic multinodular goiter: Secondary | ICD-10-CM | POA: Diagnosis not present

## 2016-10-04 DIAGNOSIS — M8580 Other specified disorders of bone density and structure, unspecified site: Secondary | ICD-10-CM | POA: Diagnosis not present

## 2016-10-04 DIAGNOSIS — R21 Rash and other nonspecific skin eruption: Secondary | ICD-10-CM | POA: Diagnosis not present

## 2016-10-04 LAB — T3, FREE: T3 FREE: 3.1 pg/mL (ref 2.3–4.2)

## 2016-10-04 LAB — T4, FREE: Free T4: 0.98 ng/dL (ref 0.60–1.60)

## 2016-10-04 LAB — TSH: TSH: 2.74 u[IU]/mL (ref 0.35–4.50)

## 2016-10-04 NOTE — Patient Instructions (Signed)
WE NOW OFFER   [facility] FAST TRACK!!!  SAME DAY Appointments for ACUTE CARE  Such as: Sprains, Injuries, cuts, abrasions, rashes, muscle pain, joint pain, back pain Colds, flu, sore throats, headache, allergies, cough, fever  Ear pain, sinus and eye infections Abdominal pain, nausea, vomiting, diarrhea, upset stomach Animal/insect bites  3 Easy Ways to Schedule: Walk-In Scheduling Call in scheduling Mychart Sign-up: [email or web address]         

## 2016-10-04 NOTE — Progress Notes (Signed)
   Subjective:    Patient ID: Sherri Charles, female    DOB: Dec 14, 1965, 51 y.o.   MRN: [id number]  HPI Here for 2 months of a rash on her face, of some mild weight loss, and of trouble sleeping. She asks to have her thyroid levels checked. Of note about 2 months ago she made some major dietary changes, she now eats no red meat, no fried foods, no processed foods or sugars. She has also been taking a number of supplements she gets OTC.    Review of Systems  Constitutional: Positive for unexpected weight change. Negative for activity change and appetite change.  Respiratory: Negative.   Cardiovascular: Negative.   Skin: Positive for rash.  Neurological: Negative.   Psychiatric/Behavioral: Positive for sleep disturbance. Negative for dysphoric mood. The patient is not nervous/anxious.        Objective:   Physical Exam  Constitutional: She is oriented to person, place, and time. She appears well-developed and well-nourished.  Neck: No thyromegaly present.  Cardiovascular: Normal rate, regular rhythm, normal heart sounds and intact distal pulses.   Pulmonary/Chest: Effort normal and breath sounds normal. No respiratory distress. She has no wheezes. She has no rales.  Lymphadenopathy:    She has no cervical adenopathy.  Neurological: She is alert and oriented to person, place, and time.  Skin:  Macular erythematous rash on the forehead and left cheek  Psychiatric: She has a normal mood and affect. Her behavior is normal. Thought content normal.          Assessment & Plan:  My first thought is that the supplements she has been taking could be causing all these effects, so I asked her to stop them for one month as a trial. We will check a thyroid panel today.  Alysia Penna, MD

## 2016-10-07 ENCOUNTER — Ambulatory Visit (INDEPENDENT_AMBULATORY_CARE_PROVIDER_SITE_OTHER)
Admission: RE | Admit: 2016-10-07 | Discharge: 2016-10-07 | Disposition: A | Discharge status: Home / Self Care | Payer: 59 | Source: Ambulatory Visit | Attending: Family Medicine | Admitting: Family Medicine

## 2016-10-07 DIAGNOSIS — M8580 Other specified disorders of bone density and structure, unspecified site: Secondary | ICD-10-CM | POA: Diagnosis not present

## 2016-11-16 DIAGNOSIS — Z01419 Encounter for gynecological examination (general) (routine) without abnormal findings: Secondary | ICD-10-CM | POA: Diagnosis not present

## 2016-11-22 ENCOUNTER — Ambulatory Visit
Admission: RE | Admit: 2016-11-22 | Discharge: 2016-11-22 | Disposition: A | Discharge status: Home / Self Care | Payer: 59 | Source: Ambulatory Visit | Attending: Obstetrics and Gynecology | Admitting: Obstetrics and Gynecology

## 2016-11-22 DIAGNOSIS — Z1231 Encounter for screening mammogram for malignant neoplasm of breast: Secondary | ICD-10-CM | POA: Diagnosis not present

## 2016-11-23 ENCOUNTER — Other Ambulatory Visit: Payer: Self-pay | Admitting: Obstetrics and Gynecology

## 2016-11-23 DIAGNOSIS — R928 Other abnormal and inconclusive findings on diagnostic imaging of breast: Secondary | ICD-10-CM

## 2016-11-25 ENCOUNTER — Ambulatory Visit
Admission: RE | Admit: 2016-11-25 | Discharge: 2016-11-25 | Disposition: A | Discharge status: Home / Self Care | Payer: 59 | Source: Ambulatory Visit | Attending: Obstetrics and Gynecology | Admitting: Obstetrics and Gynecology

## 2016-11-25 DIAGNOSIS — R922 Inconclusive mammogram: Secondary | ICD-10-CM | POA: Diagnosis not present

## 2016-11-25 DIAGNOSIS — R928 Other abnormal and inconclusive findings on diagnostic imaging of breast: Secondary | ICD-10-CM

## 2016-11-25 DIAGNOSIS — N644 Mastodynia: Secondary | ICD-10-CM | POA: Diagnosis not present

## 2016-12-16 ENCOUNTER — Encounter: Payer: Self-pay | Admitting: Family Medicine

## 2016-12-20 ENCOUNTER — Ambulatory Visit (INDEPENDENT_AMBULATORY_CARE_PROVIDER_SITE_OTHER): Payer: 59 | Admitting: Family Medicine

## 2016-12-20 ENCOUNTER — Encounter: Payer: Self-pay | Admitting: Family Medicine

## 2016-12-20 VITALS — BP 120/68 | Temp 98.1°F | Ht 59.75 in | Wt 89.0 lb

## 2016-12-20 DIAGNOSIS — Z0000 Encounter for general adult medical examination without abnormal findings: Secondary | ICD-10-CM

## 2016-12-20 DIAGNOSIS — Z23 Encounter for immunization: Secondary | ICD-10-CM | POA: Diagnosis not present

## 2016-12-20 LAB — CBC WITH DIFFERENTIAL/PLATELET
BASOS PCT: 1 % (ref 0.0–3.0)
Basophils Absolute: 0 10*3/uL (ref 0.0–0.1)
EOS ABS: 0 10*3/uL (ref 0.0–0.7)
EOS PCT: 1 % (ref 0.0–5.0)
HCT: 39.5 % (ref 36.0–46.0)
HEMOGLOBIN: 13 g/dL (ref 12.0–15.0)
LYMPHS ABS: 1.1 10*3/uL (ref 0.7–4.0)
LYMPHS PCT: 25.5 % (ref 12.0–46.0)
MCHC: 32.8 g/dL (ref 30.0–36.0)
MCV: 95.3 fl (ref 78.0–100.0)
MONO ABS: 0.2 10*3/uL (ref 0.1–1.0)
MONOS PCT: 5.4 % (ref 3.0–12.0)
NEUTROS ABS: 2.9 10*3/uL (ref 1.4–7.7)
NEUTROS PCT: 67.1 % (ref 43.0–77.0)
PLATELETS: 213 10*3/uL (ref 150.0–400.0)
RBC: 4.15 Mil/uL (ref 3.87–5.11)
RDW: 14.4 % (ref 11.5–15.5)
WBC: 4.4 10*3/uL (ref 4.0–10.5)

## 2016-12-20 LAB — POC URINALSYSI DIPSTICK (AUTOMATED)
Bilirubin, UA: NEGATIVE
Blood, UA: NEGATIVE
Glucose, UA: NEGATIVE
Ketones, UA: NEGATIVE
Leukocytes, UA: NEGATIVE
Nitrite, UA: NEGATIVE
Protein, UA: NEGATIVE
Spec Grav, UA: 1.015 (ref 1.010–1.025)
Urobilinogen, UA: 0.2 E.U./dL
pH, UA: 6.5 (ref 5.0–8.0)

## 2016-12-20 LAB — LIPID PANEL
CHOL/HDL RATIO: 2
Cholesterol: 229 mg/dL — ABNORMAL HIGH (ref 0–200)
HDL: 103.2 mg/dL (ref >39.00)
LDL Cholesterol: 113 mg/dL — ABNORMAL HIGH (ref 0–99)
NonHDL: 125.93
TRIGLYCERIDES: 64 mg/dL (ref 0.0–149.0)
VLDL: 12.8 mg/dL (ref 0.0–40.0)

## 2016-12-20 LAB — BASIC METABOLIC PANEL
BUN: 20 mg/dL (ref 6–23)
CHLORIDE: 103 meq/L (ref 96–112)
CO2: 31 mEq/L (ref 19–32)
CREATININE: 0.79 mg/dL (ref 0.40–1.20)
Calcium: 10.1 mg/dL (ref 8.4–10.5)
GFR: 81.26 mL/min (ref >60.00)
Glucose, Bld: 85 mg/dL (ref 70–99)
POTASSIUM: 4.2 meq/L (ref 3.5–5.1)
Sodium: 142 mEq/L (ref 135–145)

## 2016-12-20 LAB — HEPATIC FUNCTION PANEL
ALT: 20 U/L (ref 0–35)
AST: 18 U/L (ref 0–37)
Albumin: 4.5 g/dL (ref 3.5–5.2)
Alkaline Phosphatase: 73 U/L (ref 39–117)
BILIRUBIN DIRECT: 0.1 mg/dL (ref 0.0–0.3)
BILIRUBIN TOTAL: 0.4 mg/dL (ref 0.2–1.2)
TOTAL PROTEIN: 6.9 g/dL (ref 6.0–8.3)

## 2016-12-20 LAB — TSH: TSH: 5.25 u[IU]/mL — AB (ref 0.35–4.50)

## 2016-12-20 MED ORDER — LEVOTHYROXINE SODIUM 75 MCG PO TABS: 75.0000 ug | ORAL_TABLET | Freq: Every day | ORAL | 3 refills | Status: DC

## 2016-12-20 NOTE — Progress Notes (Signed)
   Subjective:    Patient ID: Sherri Charles, female    DOB: [birth year], 51 y.o.   MRN: [id number]  HPI Here for a well exam. She feels great. She recently saw her homeopathic provider and had some blood tests done to check for food allergies. She came back as reacting to 20 different foods, including wheat, corn, and chicken. She has adjusted her diet and now she feels better.    Review of Systems  Constitutional: Negative.   HENT: Negative.   Eyes: Negative.   Respiratory: Negative.   Cardiovascular: Negative.   Gastrointestinal: Negative.   Genitourinary: Negative for decreased urine volume, difficulty urinating, dyspareunia, dysuria, enuresis, flank pain, frequency, hematuria, pelvic pain and urgency.  Musculoskeletal: Negative.   Skin: Negative.   Neurological: Negative.   Psychiatric/Behavioral: Negative.        Objective:   Physical Exam  Constitutional: She is oriented to person, place, and time. She appears well-developed and well-nourished. No distress.  HENT:  Head: Normocephalic and atraumatic.  Right Ear: External ear normal.  Left Ear: External ear normal.  Nose: Nose normal.  Mouth/Throat: Oropharynx is clear and moist. No oropharyngeal exudate.  Eyes: Pupils are equal, round, and reactive to light. Conjunctivae and EOM are normal. No scleral icterus.  Neck: Normal range of motion. Neck supple. No JVD present. No thyromegaly present.  Cardiovascular: Normal rate, regular rhythm, normal heart sounds and intact distal pulses.  Exam reveals no gallop and no friction rub.   No murmur heard. Pulmonary/Chest: Effort normal and breath sounds normal. No respiratory distress. She has no wheezes. She has no rales. She exhibits no tenderness.  Abdominal: Soft. Bowel sounds are normal. She exhibits no distension and no mass. There is no tenderness. There is no rebound and no guarding.  Musculoskeletal: Normal range of motion. She exhibits no edema or tenderness.    Lymphadenopathy:    She has no cervical adenopathy.  Neurological: She is alert and oriented to person, place, and time. She has normal reflexes. No cranial nerve deficit. She exhibits normal muscle tone. Coordination normal.  Skin: Skin is warm and dry. No rash noted. No erythema.  Psychiatric: She has a normal mood and affect. Her behavior is normal. Judgment and thought content normal.          Assessment & Plan:  Well exam. We discussed diet and exercise. Get fasting labs today. Alysia Penna, MD

## 2016-12-20 NOTE — Patient Instructions (Signed)
WE NOW OFFER   [facility] FAST TRACK!!!  SAME DAY Appointments for ACUTE CARE  Such as: Sprains, Injuries, cuts, abrasions, rashes, muscle pain, joint pain, back pain Colds, flu, sore throats, headache, allergies, cough, fever  Ear pain, sinus and eye infections Abdominal pain, nausea, vomiting, diarrhea, upset stomach Animal/insect bites  3 Easy Ways to Schedule: Walk-In Scheduling Call in scheduling Mychart Sign-up: [email or web address]         

## 2016-12-21 ENCOUNTER — Other Ambulatory Visit: Payer: Self-pay | Admitting: Family Medicine

## 2016-12-21 MED ORDER — LEVOTHYROXINE SODIUM 100 MCG PO TABS: 100.0000 ug | ORAL_TABLET | Freq: Every day | ORAL | 3 refills | Status: DC

## 2017-03-11 ENCOUNTER — Ambulatory Visit: Payer: 59 | Admitting: Family Medicine

## 2017-03-11 ENCOUNTER — Encounter: Payer: Self-pay | Admitting: Family Medicine

## 2017-03-11 VITALS — BP 98/60 | HR 89 | Temp 98.5°F | Wt 90.6 lb

## 2017-03-11 DIAGNOSIS — M533 Sacrococcygeal disorders, not elsewhere classified: Secondary | ICD-10-CM

## 2017-03-11 DIAGNOSIS — E039 Hypothyroidism, unspecified: Secondary | ICD-10-CM

## 2017-03-11 LAB — TSH: TSH: 0.07 u[IU]/mL — AB (ref 0.35–4.50)

## 2017-03-11 MED ORDER — DICLOFENAC SODIUM 75 MG PO TBEC: 75.0000 mg | DELAYED_RELEASE_TABLET | Freq: Two times a day (BID) | ORAL | 2 refills | Status: DC

## 2017-03-11 NOTE — Progress Notes (Signed)
   Subjective:    Patient ID: Sherri Charles, female    DOB: 05/24/1965, 51 y.o.   MRN: [id number]  HPI Here for 2 issues. First she has had a sharp pain in the tailbone area for 2 months. No hx of trauma. She does go to yoga several days a week. Heat and Ibuprofen help a little. Also we adjusted her Synthroid dose 3 months ago and we need to check a level.    Review of Systems  Constitutional: Negative.   Respiratory: Negative.   Cardiovascular: Negative.   Gastrointestinal: Negative.   Genitourinary: Negative.        Objective:   Physical Exam  Constitutional: She appears well-developed and well-nourished.  Cardiovascular: Normal rate, regular rhythm, normal heart sounds and intact distal pulses.  Pulmonary/Chest: Effort normal and breath sounds normal. No respiratory distress.  Musculoskeletal:  She is tender over the tip of the coccyx. No crepitus or swelling. No masses           Assessment & Plan:  Coccydynia. Try Diclofenac 75 mg bid for a few weeks. Rest and avoid yoga for a few weeks. Recheck prn. We will also get a TSH today. Alysia Penna, MD

## 2017-03-15 ENCOUNTER — Other Ambulatory Visit: Payer: Self-pay

## 2017-03-15 MED ORDER — LEVOTHYROXINE SODIUM 75 MCG PO TABS: 75.0000 ug | ORAL_TABLET | Freq: Every day | ORAL | 3 refills | Status: DC

## 2017-03-15 NOTE — Telephone Encounter (Signed)
Rx sent for synthroid 75 MCG per resent TSH results

## 2017-06-13 ENCOUNTER — Other Ambulatory Visit: Payer: Self-pay | Admitting: Family Medicine

## 2017-07-13 ENCOUNTER — Encounter: Payer: Self-pay | Admitting: Family Medicine

## 2017-07-14 NOTE — Telephone Encounter (Signed)
As long as you were immunized as a child, you should be protected adequately

## 2017-10-27 ENCOUNTER — Ambulatory Visit (INDEPENDENT_AMBULATORY_CARE_PROVIDER_SITE_OTHER): Payer: Self-pay

## 2017-10-27 ENCOUNTER — Ambulatory Visit (INDEPENDENT_AMBULATORY_CARE_PROVIDER_SITE_OTHER): Payer: BLUE CROSS/BLUE SHIELD | Admitting: Orthopedic Surgery

## 2017-10-27 ENCOUNTER — Encounter (INDEPENDENT_AMBULATORY_CARE_PROVIDER_SITE_OTHER): Payer: Self-pay | Admitting: Orthopedic Surgery

## 2017-10-27 VITALS — BP 119/64 | HR 79 | Ht 60.0 in | Wt 91.0 lb

## 2017-10-27 DIAGNOSIS — M545 Low back pain: Secondary | ICD-10-CM

## 2017-10-27 DIAGNOSIS — G8929 Other chronic pain: Secondary | ICD-10-CM

## 2017-10-27 DIAGNOSIS — S8012XA Contusion of left lower leg, initial encounter: Secondary | ICD-10-CM

## 2017-10-27 DIAGNOSIS — M25572 Pain in left ankle and joints of left foot: Secondary | ICD-10-CM

## 2017-10-27 DIAGNOSIS — S93602A Unspecified sprain of left foot, initial encounter: Secondary | ICD-10-CM | POA: Diagnosis not present

## 2017-10-27 DIAGNOSIS — M533 Sacrococcygeal disorders, not elsewhere classified: Secondary | ICD-10-CM

## 2017-10-27 DIAGNOSIS — S8002XA Contusion of left knee, initial encounter: Secondary | ICD-10-CM | POA: Diagnosis not present
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
## 2017-11-14 ENCOUNTER — Ambulatory Visit (INDEPENDENT_AMBULATORY_CARE_PROVIDER_SITE_OTHER): Payer: 59 | Admitting: Orthopaedic Surgery

## 2017-11-18 ENCOUNTER — Ambulatory Visit (INDEPENDENT_AMBULATORY_CARE_PROVIDER_SITE_OTHER): Payer: 59 | Admitting: Orthopaedic Surgery

## 2017-11-18 ENCOUNTER — Encounter (INDEPENDENT_AMBULATORY_CARE_PROVIDER_SITE_OTHER): Payer: Self-pay | Admitting: Orthopaedic Surgery

## 2017-11-18 ENCOUNTER — Ambulatory Visit (INDEPENDENT_AMBULATORY_CARE_PROVIDER_SITE_OTHER): Payer: Self-pay

## 2017-11-18 VITALS — BP 116/67 | HR 75 | Ht 60.0 in | Wt 91.0 lb

## 2017-11-18 DIAGNOSIS — M79672 Pain in left foot: Secondary | ICD-10-CM | POA: Diagnosis not present
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
## 2017-11-21 ENCOUNTER — Ambulatory Visit (INDEPENDENT_AMBULATORY_CARE_PROVIDER_SITE_OTHER): Payer: 59 | Admitting: Orthopaedic Surgery

## 2017-12-05 ENCOUNTER — Other Ambulatory Visit: Payer: Self-pay | Admitting: Obstetrics and Gynecology

## 2017-12-05 DIAGNOSIS — Z1231 Encounter for screening mammogram for malignant neoplasm of breast: Secondary | ICD-10-CM

## 2017-12-15 DIAGNOSIS — D225 Melanocytic nevi of trunk: Secondary | ICD-10-CM | POA: Diagnosis not present

## 2017-12-15 DIAGNOSIS — L570 Actinic keratosis: Secondary | ICD-10-CM | POA: Diagnosis not present

## 2017-12-15 DIAGNOSIS — D2261 Melanocytic nevi of right upper limb, including shoulder: Secondary | ICD-10-CM | POA: Diagnosis not present

## 2017-12-15 DIAGNOSIS — L814 Other melanin hyperpigmentation: Secondary | ICD-10-CM | POA: Diagnosis not present

## 2017-12-21 ENCOUNTER — Ambulatory Visit (INDEPENDENT_AMBULATORY_CARE_PROVIDER_SITE_OTHER): Payer: 59 | Admitting: Family Medicine

## 2017-12-21 ENCOUNTER — Encounter: Payer: Self-pay | Admitting: Family Medicine

## 2017-12-21 VITALS — BP 98/62 | HR 76 | Temp 98.2°F | Ht 60.5 in | Wt 93.2 lb

## 2017-12-21 DIAGNOSIS — Z23 Encounter for immunization: Secondary | ICD-10-CM

## 2017-12-21 DIAGNOSIS — E039 Hypothyroidism, unspecified: Secondary | ICD-10-CM | POA: Diagnosis not present

## 2017-12-21 DIAGNOSIS — Z0000 Encounter for general adult medical examination without abnormal findings: Secondary | ICD-10-CM | POA: Diagnosis not present

## 2017-12-21 LAB — LIPID PANEL
Cholesterol: 209 mg/dL — ABNORMAL HIGH (ref 0–200)
HDL: 97.2 mg/dL (ref >39.00)
LDL CALC: 98 mg/dL (ref 0–99)
NONHDL: 111.38
Total CHOL/HDL Ratio: 2
Triglycerides: 66 mg/dL (ref 0.0–149.0)
VLDL: 13.2 mg/dL (ref 0.0–40.0)

## 2017-12-21 LAB — CBC WITH DIFFERENTIAL/PLATELET
BASOS ABS: 0 10*3/uL (ref 0.0–0.1)
BASOS PCT: 0.8 % (ref 0.0–3.0)
Eosinophils Absolute: 0.1 10*3/uL (ref 0.0–0.7)
Eosinophils Relative: 1.2 % (ref 0.0–5.0)
HEMATOCRIT: 39.7 % (ref 36.0–46.0)
HEMOGLOBIN: 13.4 g/dL (ref 12.0–15.0)
LYMPHS PCT: 24.6 % (ref 12.0–46.0)
Lymphs Abs: 1.2 10*3/uL (ref 0.7–4.0)
MCHC: 33.8 g/dL (ref 30.0–36.0)
MCV: 91.5 fl (ref 78.0–100.0)
Monocytes Absolute: 0.2 10*3/uL (ref 0.1–1.0)
Monocytes Relative: 4.7 % (ref 3.0–12.0)
Neutro Abs: 3.3 10*3/uL (ref 1.4–7.7)
Neutrophils Relative %: 68.7 % (ref 43.0–77.0)
Platelets: 211 10*3/uL (ref 150.0–400.0)
RBC: 4.34 Mil/uL (ref 3.87–5.11)
RDW: 14.3 % (ref 11.5–15.5)
WBC: 4.8 10*3/uL (ref 4.0–10.5)

## 2017-12-21 LAB — BASIC METABOLIC PANEL
BUN: 15 mg/dL (ref 6–23)
CALCIUM: 10.4 mg/dL (ref 8.4–10.5)
CO2: 29 mEq/L (ref 19–32)
CREATININE: 0.9 mg/dL (ref 0.40–1.20)
Chloride: 104 mEq/L (ref 96–112)
GFR: 69.64 mL/min (ref >60.00)
Glucose, Bld: 94 mg/dL (ref 70–99)
Potassium: 4.9 mEq/L (ref 3.5–5.1)
Sodium: 142 mEq/L (ref 135–145)

## 2017-12-21 LAB — POC URINALSYSI DIPSTICK (AUTOMATED)
Bilirubin, UA: NEGATIVE
Blood, UA: NEGATIVE
GLUCOSE UA: NEGATIVE
Ketones, UA: POSITIVE
Leukocytes, UA: NEGATIVE
NITRITE UA: NEGATIVE
PROTEIN UA: NEGATIVE
Spec Grav, UA: 1.02 (ref 1.010–1.025)
UROBILINOGEN UA: 0.2 U/dL
pH, UA: 6 (ref 5.0–8.0)

## 2017-12-21 LAB — HEPATIC FUNCTION PANEL
ALBUMIN: 4.5 g/dL (ref 3.5–5.2)
ALK PHOS: 86 U/L (ref 39–117)
ALT: 23 U/L (ref 0–35)
AST: 21 U/L (ref 0–37)
BILIRUBIN DIRECT: 0.1 mg/dL (ref 0.0–0.3)
TOTAL PROTEIN: 7 g/dL (ref 6.0–8.3)
Total Bilirubin: 0.6 mg/dL (ref 0.2–1.2)

## 2017-12-21 LAB — TSH: TSH: 1.75 u[IU]/mL (ref 0.35–4.50)

## 2017-12-21 LAB — T4, FREE: FREE T4: 1.24 ng/dL (ref 0.60–1.60)

## 2017-12-21 LAB — T3, FREE: T3 FREE: 2.9 pg/mL (ref 2.3–4.2)

## 2017-12-21 NOTE — Progress Notes (Signed)
   Subjective:    Patient ID: Sherri [place], female    DOB: 10-23-1965, 52 y.o.   MRN: [id number]  HPI Here for a well exam. She feels great.    Review of Systems  Constitutional: Negative.   HENT: Negative.   Eyes: Negative.   Respiratory: Negative.   Cardiovascular: Negative.   Gastrointestinal: Negative.   Genitourinary: Negative for decreased urine volume, difficulty urinating, dyspareunia, dysuria, enuresis, flank pain, frequency, hematuria, pelvic pain and urgency.  Musculoskeletal: Negative.   Skin: Negative.   Neurological: Negative.   Psychiatric/Behavioral: Negative.        Objective:   Physical Exam  Constitutional: She is oriented to person, place, and time. She appears well-developed and well-nourished. No distress.  HENT:  Head: Normocephalic and atraumatic.  Right Ear: External ear normal.  Left Ear: External ear normal.  Nose: Nose normal.  Mouth/Throat: Oropharynx is clear and moist. No oropharyngeal exudate.  Eyes: Pupils are equal, round, and reactive to light. Conjunctivae and EOM are normal. No scleral icterus.  Neck: Normal range of motion. Neck supple. No JVD present. No thyromegaly present.  Cardiovascular: Normal rate, regular rhythm, normal heart sounds and intact distal pulses. Exam reveals no gallop and no friction rub.  No murmur heard. Pulmonary/Chest: Effort normal and breath sounds normal. No respiratory distress. She has no wheezes. She has no rales. She exhibits no tenderness.  Abdominal: Soft. Bowel sounds are normal. She exhibits no distension and no mass. There is no tenderness. There is no rebound and no guarding.  Musculoskeletal: Normal range of motion. She exhibits no edema or tenderness.  Lymphadenopathy:    She has no cervical adenopathy.  Neurological: She is alert and oriented to person, place, and time. She has normal reflexes. She displays normal reflexes. No cranial nerve deficit. She exhibits normal muscle tone. Coordination  normal.  Skin: Skin is warm and dry. No rash noted. No erythema.  Psychiatric: She has a normal mood and affect. Her behavior is normal. Judgment and thought content normal.          Assessment & Plan:  Well exam. We discussed diet and exercise. Get fasting labs.  Sherri Penna, MD

## 2017-12-22 ENCOUNTER — Telehealth: Payer: Self-pay | Admitting: Family Medicine

## 2017-12-22 NOTE — Telephone Encounter (Signed)
Copied from [place] [id number]. Topic: General - Other >> Dec 22, 2017 11:01 AM Alfredia Ferguson R wrote: Patient called in and stated she is wanting to come in tomorrow morning for Shingrix vaccine. Her daughter has an appointment tomorrow and doesn't want to make a double trip  Cb# [phone]

## 2017-12-23 ENCOUNTER — Ambulatory Visit (INDEPENDENT_AMBULATORY_CARE_PROVIDER_SITE_OTHER): Payer: 59 | Admitting: *Deleted

## 2017-12-23 DIAGNOSIS — Z23 Encounter for immunization: Secondary | ICD-10-CM | POA: Diagnosis not present

## 2017-12-23 NOTE — Telephone Encounter (Signed)
Pt was seen today and did receive her shingrix.

## 2017-12-28 ENCOUNTER — Ambulatory Visit
Admission: RE | Admit: 2017-12-28 | Discharge: 2017-12-28 | Disposition: A | Discharge status: Home / Self Care | Payer: 59 | Source: Ambulatory Visit | Attending: Obstetrics and Gynecology | Admitting: Obstetrics and Gynecology

## 2017-12-28 DIAGNOSIS — Z1231 Encounter for screening mammogram for malignant neoplasm of breast: Secondary | ICD-10-CM

## 2018-01-10 DIAGNOSIS — Z01419 Encounter for gynecological examination (general) (routine) without abnormal findings: Secondary | ICD-10-CM | POA: Diagnosis not present

## 2018-03-20 ENCOUNTER — Ambulatory Visit (INDEPENDENT_AMBULATORY_CARE_PROVIDER_SITE_OTHER): Payer: 59 | Admitting: *Deleted

## 2018-03-20 DIAGNOSIS — Z23 Encounter for immunization: Secondary | ICD-10-CM

## 2018-03-20 NOTE — Progress Notes (Signed)
Per orders of Dr. Sarajane Jews, injection of Shingrix given by Westley Hummer. Patient tolerated injection well.

## 2018-05-25 ENCOUNTER — Other Ambulatory Visit: Payer: Self-pay | Admitting: Family Medicine

## 2018-12-22 ENCOUNTER — Other Ambulatory Visit: Payer: Self-pay | Admitting: Obstetrics and Gynecology

## 2018-12-22 DIAGNOSIS — Z1231 Encounter for screening mammogram for malignant neoplasm of breast: Secondary | ICD-10-CM

## 2019-01-03 ENCOUNTER — Other Ambulatory Visit: Payer: Self-pay

## 2019-01-03 DIAGNOSIS — Z20822 Contact with and (suspected) exposure to covid-19: Secondary | ICD-10-CM

## 2019-01-05 LAB — NOVEL CORONAVIRUS, NAA: SARS-CoV-2, NAA: NOT DETECTED

## 2019-01-16 ENCOUNTER — Encounter: Payer: 59 | Admitting: Family Medicine

## 2019-02-07 ENCOUNTER — Other Ambulatory Visit: Payer: Self-pay

## 2019-02-07 ENCOUNTER — Ambulatory Visit (INDEPENDENT_AMBULATORY_CARE_PROVIDER_SITE_OTHER): Payer: Commercial Managed Care - PPO | Admitting: Family Medicine

## 2019-02-07 ENCOUNTER — Encounter: Payer: Self-pay | Admitting: Family Medicine

## 2019-02-07 VITALS — BP 120/70 | HR 82 | Temp 98.1°F | Ht 60.0 in | Wt 99.0 lb

## 2019-02-07 DIAGNOSIS — M8580 Other specified disorders of bone density and structure, unspecified site: Secondary | ICD-10-CM

## 2019-02-07 DIAGNOSIS — Z0000 Encounter for general adult medical examination without abnormal findings: Secondary | ICD-10-CM | POA: Diagnosis not present

## 2019-02-07 DIAGNOSIS — E039 Hypothyroidism, unspecified: Secondary | ICD-10-CM | POA: Diagnosis not present

## 2019-02-07 LAB — CBC WITH DIFFERENTIAL/PLATELET
Basophils Absolute: 0.1 10*3/uL (ref 0.0–0.1)
Basophils Relative: 0.9 % (ref 0.0–3.0)
Eosinophils Absolute: 0.1 10*3/uL (ref 0.0–0.7)
Eosinophils Relative: 1.2 % (ref 0.0–5.0)
HCT: 40.8 % (ref 36.0–46.0)
Hemoglobin: 13.5 g/dL (ref 12.0–15.0)
Lymphocytes Relative: 22.6 % (ref 12.0–46.0)
Lymphs Abs: 1.2 10*3/uL (ref 0.7–4.0)
MCHC: 33.2 g/dL (ref 30.0–36.0)
MCV: 92.4 fl (ref 78.0–100.0)
Monocytes Absolute: 0.2 10*3/uL (ref 0.1–1.0)
Monocytes Relative: 4 % (ref 3.0–12.0)
Neutro Abs: 3.8 10*3/uL (ref 1.4–7.7)
Neutrophils Relative %: 71.3 % (ref 43.0–77.0)
Platelets: 233 10*3/uL (ref 150.0–400.0)
RBC: 4.41 Mil/uL (ref 3.87–5.11)
RDW: 13.9 % (ref 11.5–15.5)
WBC: 5.3 10*3/uL (ref 4.0–10.5)

## 2019-02-07 LAB — LIPID PANEL
Cholesterol: 241 mg/dL — ABNORMAL HIGH (ref 0–200)
HDL: 104.5 mg/dL (ref >39.00)
LDL Cholesterol: 119 mg/dL — ABNORMAL HIGH (ref 0–99)
NonHDL: 136.15
Total CHOL/HDL Ratio: 2
Triglycerides: 87 mg/dL (ref 0.0–149.0)
VLDL: 17.4 mg/dL (ref 0.0–40.0)

## 2019-02-07 LAB — HEPATIC FUNCTION PANEL
ALT: 14 U/L (ref 0–35)
AST: 17 U/L (ref 0–37)
Albumin: 4.7 g/dL (ref 3.5–5.2)
Alkaline Phosphatase: 88 U/L (ref 39–117)
Bilirubin, Direct: 0.1 mg/dL (ref 0.0–0.3)
Total Bilirubin: 0.5 mg/dL (ref 0.2–1.2)
Total Protein: 7.3 g/dL (ref 6.0–8.3)

## 2019-02-07 LAB — BASIC METABOLIC PANEL
BUN: 13 mg/dL (ref 6–23)
CO2: 32 mEq/L (ref 19–32)
Calcium: 10.4 mg/dL (ref 8.4–10.5)
Chloride: 101 mEq/L (ref 96–112)
Creatinine, Ser: 0.85 mg/dL (ref 0.40–1.20)
GFR: 69.69 mL/min (ref >60.00)
Glucose, Bld: 93 mg/dL (ref 70–99)
Potassium: 4.1 mEq/L (ref 3.5–5.1)
Sodium: 140 mEq/L (ref 135–145)

## 2019-02-07 LAB — TSH: TSH: 0.31 u[IU]/mL — ABNORMAL LOW (ref 0.35–4.50)

## 2019-02-07 LAB — VITAMIN D 25 HYDROXY (VIT D DEFICIENCY, FRACTURES): VITD: >120 ng/mL

## 2019-02-07 LAB — T4, FREE: Free T4: 1.61 ng/dL — ABNORMAL HIGH (ref 0.60–1.60)

## 2019-02-07 LAB — T3, FREE: T3, Free: 3.2 pg/mL (ref 2.3–4.2)

## 2019-02-07 MED ORDER — LEVOTHYROXINE SODIUM 50 MCG PO TABS: 50.0000 ug | ORAL_TABLET | Freq: Every day | ORAL | 3 refills | Status: DC

## 2019-02-07 NOTE — Progress Notes (Signed)
   Subjective:    Patient ID: [place], female    DOB: May 06, 1965, 53 y.o.   MRN: [id number]  HPI Here for a well exam. She feels great.    Review of Systems  Constitutional: Negative.   HENT: Negative.   Eyes: Negative.   Respiratory: Negative.   Cardiovascular: Negative.   Gastrointestinal: Negative.   Genitourinary: Negative for decreased urine volume, difficulty urinating, dyspareunia, dysuria, enuresis, flank pain, frequency, hematuria, pelvic pain and urgency.  Musculoskeletal: Negative.   Skin: Negative.   Neurological: Negative.   Psychiatric/Behavioral: Negative.        Objective:   Physical Exam Constitutional:      General: She is not in acute distress.    Appearance: She is well-developed.  HENT:     Head: Normocephalic and atraumatic.     Right Ear: External ear normal.     Left Ear: External ear normal.     Nose: Nose normal.     Mouth/Throat:     Pharynx: No oropharyngeal exudate.  Eyes:     General: No scleral icterus.    Conjunctiva/sclera: Conjunctivae normal.     Pupils: Pupils are equal, round, and reactive to light.  Neck:     Musculoskeletal: Normal range of motion and neck supple.     Thyroid: No thyromegaly.     Vascular: No JVD.  Cardiovascular:     Rate and Rhythm: Normal rate and regular rhythm.     Heart sounds: Normal heart sounds. No murmur. No friction rub. No gallop.   Pulmonary:     Effort: Pulmonary effort is normal. No respiratory distress.     Breath sounds: Normal breath sounds. No wheezing or rales.  Chest:     Chest wall: No tenderness.  Abdominal:     General: Bowel sounds are normal. There is no distension.     Palpations: Abdomen is soft. There is no mass.     Tenderness: There is no abdominal tenderness. There is no guarding or rebound.  Musculoskeletal: Normal range of motion.        General: No tenderness.  Lymphadenopathy:     Cervical: No cervical adenopathy.  Skin:    General: Skin is warm and dry.   Findings: No erythema or rash.  Neurological:     Mental Status: She is alert and oriented to person, place, and time.     Cranial Nerves: No cranial nerve deficit.     Motor: No abnormal muscle tone.     Coordination: Coordination normal.     Deep Tendon Reflexes: Reflexes are normal and symmetric. Reflexes normal.  Psychiatric:        Behavior: Behavior normal.        Thought Content: Thought content normal.        Judgment: Judgment normal.           Assessment & Plan:  Well exam. We discussed diet and exercise. Get fasting labs. Set up a DEXA.  Alysia Penna, MD

## 2019-02-07 NOTE — Patient Instructions (Signed)
Health Maintenance Due  Topic Date Due  . HIV Screening  01/23/1980  . TETANUS/TDAP  03/29/2016  . PAP SMEAR-Modifier  09/08/2017  . INFLUENZA VACCINE  10/28/2018    No flowsheet data found.

## 2019-02-07 NOTE — Addendum Note (Signed)
Addended by: Gwenyth Ober R on: 02/07/2019 05:05 PM   Modules accepted: Orders

## 2019-02-08 ENCOUNTER — Ambulatory Visit
Admission: RE | Admit: 2019-02-08 | Discharge: 2019-02-08 | Disposition: A | Discharge status: Home / Self Care | Payer: Commercial Managed Care - PPO | Source: Ambulatory Visit | Attending: Obstetrics and Gynecology | Admitting: Obstetrics and Gynecology

## 2019-02-08 DIAGNOSIS — Z1231 Encounter for screening mammogram for malignant neoplasm of breast: Secondary | ICD-10-CM

## 2019-04-01 ENCOUNTER — Encounter: Payer: Self-pay | Admitting: Family Medicine

## 2019-04-02 NOTE — Telephone Encounter (Signed)
(  1) I had ordered a DEXA on 02-07-19. Please make sure this gets scheduled for her. (2) as far as her daughter, Rubin Payor, tell her she would only need TWO doses of the HPV vaccine

## 2019-04-03 NOTE — Telephone Encounter (Signed)
Spoke with deborah in regards to this. She stated that this is scheduled by our front staff. Will send to [facility] for assistance.

## 2019-04-13 ENCOUNTER — Other Ambulatory Visit: Payer: Commercial Managed Care - PPO

## 2019-04-16 ENCOUNTER — Inpatient Hospital Stay: Admission: RE | Admit: 2019-04-16 | Payer: Commercial Managed Care - PPO | Source: Ambulatory Visit

## 2019-04-27 ENCOUNTER — Other Ambulatory Visit: Payer: Self-pay

## 2019-04-27 ENCOUNTER — Ambulatory Visit (INDEPENDENT_AMBULATORY_CARE_PROVIDER_SITE_OTHER)
Admission: RE | Admit: 2019-04-27 | Discharge: 2019-04-27 | Disposition: A | Discharge status: Home / Self Care | Payer: Commercial Managed Care - PPO | Source: Ambulatory Visit | Attending: Family Medicine | Admitting: Family Medicine

## 2019-04-27 DIAGNOSIS — M8580 Other specified disorders of bone density and structure, unspecified site: Secondary | ICD-10-CM

## 2019-05-06 ENCOUNTER — Encounter: Payer: Self-pay | Admitting: Family Medicine

## 2019-05-06 DIAGNOSIS — E039 Hypothyroidism, unspecified: Secondary | ICD-10-CM

## 2019-05-10 NOTE — Telephone Encounter (Signed)
All these orders were placed

## 2019-05-16 ENCOUNTER — Other Ambulatory Visit: Payer: Self-pay

## 2019-05-17 ENCOUNTER — Other Ambulatory Visit: Payer: Commercial Managed Care - PPO

## 2019-05-23 ENCOUNTER — Other Ambulatory Visit: Payer: Self-pay

## 2019-05-24 ENCOUNTER — Other Ambulatory Visit (INDEPENDENT_AMBULATORY_CARE_PROVIDER_SITE_OTHER): Payer: Commercial Managed Care - PPO

## 2019-05-24 DIAGNOSIS — E039 Hypothyroidism, unspecified: Secondary | ICD-10-CM | POA: Diagnosis not present

## 2019-05-24 LAB — BASIC METABOLIC PANEL
BUN: 12 mg/dL (ref 6–23)
CO2: 30 mEq/L (ref 19–32)
Calcium: 10.4 mg/dL (ref 8.4–10.5)
Chloride: 102 mEq/L (ref 96–112)
Creatinine, Ser: 0.92 mg/dL (ref 0.40–1.20)
GFR: 63.54 mL/min (ref >60.00)
Glucose, Bld: 88 mg/dL (ref 70–99)
Potassium: 4.6 mEq/L (ref 3.5–5.1)
Sodium: 140 mEq/L (ref 135–145)

## 2019-05-24 LAB — VITAMIN D 25 HYDROXY (VIT D DEFICIENCY, FRACTURES): VITD: 94.58 ng/mL (ref 30.00–100.00)

## 2019-05-24 LAB — T4, FREE: Free T4: 0.94 ng/dL (ref 0.60–1.60)

## 2019-05-24 LAB — TSH: TSH: 12.48 u[IU]/mL — ABNORMAL HIGH (ref 0.35–4.50)

## 2019-05-24 LAB — T3, FREE: T3, Free: 2.6 pg/mL (ref 2.3–4.2)

## 2019-05-26 ENCOUNTER — Ambulatory Visit: Payer: Commercial Managed Care - PPO

## 2019-05-30 ENCOUNTER — Encounter: Payer: Self-pay | Admitting: Family Medicine

## 2019-05-30 NOTE — Telephone Encounter (Signed)
Please advise 

## 2019-05-30 NOTE — Telephone Encounter (Signed)
Actually we go by the free T3 and free T4 more than the TSH, so her level right now is on target. Nothing else needs to be done now

## 2019-07-10 ENCOUNTER — Encounter: Payer: Self-pay | Admitting: Gastroenterology

## 2019-08-10 ENCOUNTER — Ambulatory Visit (AMBULATORY_SURGERY_CENTER): Payer: Self-pay | Admitting: *Deleted

## 2019-08-10 ENCOUNTER — Other Ambulatory Visit: Payer: Self-pay

## 2019-08-10 VITALS — Temp 97.1°F | Ht 60.0 in | Wt 99.2 lb

## 2019-08-10 DIAGNOSIS — Z1211 Encounter for screening for malignant neoplasm of colon: Secondary | ICD-10-CM

## 2019-08-10 MED ORDER — SUTAB 1479-225-188 MG PO TABS: 24.0000 | ORAL_TABLET | ORAL | 0 refills | Status: DC

## 2019-08-10 NOTE — Progress Notes (Signed)
06-14-19 comp covid vaccine  No egg or soy allergy known to patient  issues with past sedation with any surgeries  or procedures- PONV , no intubation problems  No diet pills per patient No home 02 use per patient  No blood thinners per patient  Pt denies issues with constipation  No A fib or A flutter  EMMI video sent to pt's e mail   Due to the COVID-19 pandemic we are asking patients to follow these guidelines. Please only bring one care partner. Please be aware that your care partner may wait in the car in the parking lot or if they feel like they will be too hot to wait in the car, they may wait in the lobby on the 4th floor. All care partners are required to wear a mask the entire time (we do not have any that we can provide them), they need to practice social distancing, and we will do a Covid check for all patient's and care partners when you arrive. Also we will check their temperature and your temperature. If the care partner waits in their car they need to stay in the parking lot the entire time and we will call them on their cell phone when the patient is ready for discharge so they can bring the car to the front of the building. Also all patient's will need to wear a mask into building.  sutab code to pharmacy and coupon to pt

## 2019-08-24 ENCOUNTER — Encounter: Payer: Commercial Managed Care - PPO | Admitting: Gastroenterology

## 2019-09-17 ENCOUNTER — Encounter: Payer: Self-pay | Admitting: Gastroenterology

## 2019-09-21 ENCOUNTER — Encounter: Payer: Commercial Managed Care - PPO | Admitting: Gastroenterology

## 2019-09-24 ENCOUNTER — Ambulatory Visit (AMBULATORY_SURGERY_CENTER): Payer: Commercial Managed Care - PPO | Admitting: Gastroenterology

## 2019-09-24 ENCOUNTER — Other Ambulatory Visit: Payer: Self-pay

## 2019-09-24 ENCOUNTER — Encounter: Payer: Self-pay | Admitting: Gastroenterology

## 2019-09-24 VITALS — BP 120/72 | HR 81 | Temp 97.7°F | Resp 18 | Ht 60.0 in | Wt 99.2 lb

## 2019-09-24 DIAGNOSIS — Z1211 Encounter for screening for malignant neoplasm of colon: Secondary | ICD-10-CM

## 2019-09-24 DIAGNOSIS — D123 Benign neoplasm of transverse colon: Secondary | ICD-10-CM

## 2019-09-24 HISTORY — PX: COLONOSCOPY: SHX174

## 2019-09-24 MED ORDER — SODIUM CHLORIDE 0.9 % IV SOLN: 500.0000 mL | Freq: Once | INTRAVENOUS | Status: DC

## 2019-09-24 NOTE — Progress Notes (Signed)
Zofran ok'd by Dr Fuller Plan

## 2019-09-24 NOTE — Progress Notes (Signed)
VS by VV   Pt's states no medical or surgical changes since previsit or office visit.

## 2019-09-24 NOTE — Progress Notes (Signed)
Report to PACU, RN, vss, BBS= Clear.  

## 2019-09-24 NOTE — Op Note (Signed)
[facility] Patient Name: Sherri Charles Procedure Date: 09/24/2019 10:33 AM MRN: [id number] Endoscopist: Ladene Artist , MD Age: 54 Referring MD:  Date of Birth: 08/22/1965 Gender: Female Account #: [id number] Procedure:                Colonoscopy Indications:              Screening for colorectal malignant neoplasm Medicines:                Monitored Anesthesia Care Procedure:                Pre-Anesthesia Assessment:                           - Prior to the procedure, a History and Physical                            was performed, and patient medications and                            allergies were reviewed. The patient's tolerance of                            previous anesthesia was also reviewed. The risks                            and benefits of the procedure and the sedation                            options and risks were discussed with the patient.                            All questions were answered, and informed consent                            was obtained. Prior Anticoagulants: The patient has                            taken no previous anticoagulant or antiplatelet                            agents. ASA Grade Assessment: II - A patient with                            mild systemic disease. After reviewing the risks                            and benefits, the patient was deemed in                            satisfactory condition to undergo the procedure.                           After obtaining informed consent, the colonoscope  was passed under direct vision. Throughout the                            procedure, the patient's blood pressure, pulse, and                            oxygen saturations were monitored continuously. The                            Colonoscope was introduced through the anus and                            advanced to the the cecum, identified by                            appendiceal orifice and  ileocecal valve. The                            ileocecal valve, appendiceal orifice, and rectum                            were photographed. The quality of the bowel                            preparation was excellent. The colonoscopy was                            somewhat difficult due to significant looping and a                            tortuous colon. The patient tolerated the procedure                            well. Scope In: 10:35:48 AM Scope Out: 10:50:58 AM Scope Withdrawal Time: 0 hours 9 minutes 29 seconds  Total Procedure Duration: 0 hours 15 minutes 10 seconds  Findings:                 The perianal and digital rectal examinations were                            normal.                           A 7 mm polyp was found in the transverse colon. The                            polyp was sessile. The polyp was removed with a                            cold snare. Resection and retrieval were complete.                           Internal hemorrhoids were found during  retroflexion. The hemorrhoids were medium-sized and                            Grade I (internal hemorrhoids that do not prolapse).                           The exam was otherwise without abnormality on                            direct and retroflexion views. Complications:            No immediate complications. Estimated blood loss:                            None. Estimated Blood Loss:     Estimated blood loss: none. Impression:               - One 7 mm polyp in the transverse colon, removed                            with a cold snare. Resected and retrieved.                           - Internal hemorrhoids.                           - The examination was otherwise normal on direct                            and retroflexion views. Recommendation:           - Repeat colonoscopy after studies are complete for                            surveillance based on pathology results.                            - Patient has a contact number available for                            emergencies. The signs and symptoms of potential                            delayed complications were discussed with the                            patient. Return to normal activities tomorrow.                            Written discharge instructions were provided to the                            patient.                           - Resume previous diet.                           -  Continue present medications.                           - Await pathology results. Ladene Artist, MD 09/24/2019 10:55:28 AM This report has been signed electronically.

## 2019-09-24 NOTE — Progress Notes (Signed)
Called to room to assist during endoscopic procedure.  Patient ID and intended procedure confirmed with present staff. Received instructions for my participation in the procedure from the performing physician.  

## 2019-09-24 NOTE — Patient Instructions (Signed)
Impression/Recommendations:  Polyp handout given to patient. Hemorrhoid handout given to patient.  Repeat colonoscopy for surveillance.  Date to be determined after pathology results reviewed.  Resume previous diet. Continue present medications. Await pathology results.  YOU HAD AN ENDOSCOPIC PROCEDURE TODAY AT [facility]:   Refer to the procedure report that was given to you for any specific questions about what was found during the examination.  If the procedure report does not answer your questions, please call your gastroenterologist to clarify.  If you requested that your care partner not be given the details of your procedure findings, then the procedure report has been included in a sealed envelope for you to review at your convenience later.  YOU SHOULD EXPECT: Some feelings of bloating in the abdomen. Passage of more gas than usual.  Walking can help get rid of the air that was put into your GI tract during the procedure and reduce the bloating. If you had a lower endoscopy (such as a colonoscopy or flexible sigmoidoscopy) you may notice spotting of blood in your stool or on the toilet paper. If you underwent a bowel prep for your procedure, you may not have a normal bowel movement for a few days.  Please Note:  You might notice some irritation and congestion in your nose or some drainage.  This is from the oxygen used during your procedure.  There is no need for concern and it should clear up in a day or so.  SYMPTOMS TO REPORT IMMEDIATELY:   Following lower endoscopy (colonoscopy or flexible sigmoidoscopy):  Excessive amounts of blood in the stool  Significant tenderness or worsening of abdominal pains  Swelling of the abdomen that is new, acute  Fever of 100F or higher For urgent or emergent issues, a gastroenterologist can be reached at any hour by calling [phone]. Do not use MyChart messaging for urgent concerns.    DIET:  We do recommend a small  meal at first, but then you may proceed to your regular diet.  Drink plenty of fluids but you should avoid alcoholic beverages for 24 hours.  ACTIVITY:  You should plan to take it easy for the rest of today and you should NOT DRIVE or use heavy machinery until tomorrow (because of the sedation medicines used during the test).    FOLLOW UP: Our staff will call the number listed on your records 48-72 hours following your procedure to check on you and address any questions or concerns that you may have regarding the information given to you following your procedure. If we do not reach you, we will leave a message.  We will attempt to reach you two times.  During this call, we will ask if you have developed any symptoms of COVID 19. If you develop any symptoms (ie: fever, flu-like symptoms, shortness of breath, cough etc.) before then, please call [phone].  If you test positive for Covid 19 in the 2 weeks post procedure, please call and report this information to [place].    If any biopsies were taken you will be contacted by phone or by letter within the next 1-3 weeks.  Please call us at [phone] if you have not heard about the biopsies in 3 weeks.    SIGNATURES/CONFIDENTIALITY: You and/or your care partner have signed paperwork which will be entered into your electronic medical record.  These signatures attest to the fact that that the information above on your After Visit Summary has been reviewed and is  understood.  Full responsibility of the confidentiality of this discharge information lies with you and/or your care-partner. 

## 2019-09-26 ENCOUNTER — Telehealth: Payer: Self-pay | Admitting: *Deleted

## 2019-09-26 NOTE — Telephone Encounter (Signed)
°  Follow up Call-  Call back number 09/24/2019  Post procedure Call Back phone  # [phone]  Permission to leave phone message Yes  Some recent data might be hidden     Patient questions:  Do you have a fever, pain , or abdominal swelling? Yes.   Pain Score  1 *- reports "very mild gas pains that started yesterday after I ate whatever I wanted." Pt reports that she does not feel the pain is severe and will resolve. Instructed pt that she could try an OTC medication like GasX for her gas symptoms if she felt it was necessary. Pt instructed to call back if pain worsens, she is agreeable.   Have you tolerated food without any problems? Yes.    Have you been able to return to your normal activities? Yes.    Do you have any questions about your discharge instructions: Diet   No. Medications  No. Follow up visit  No.  Do you have questions or concerns about your Care? No.  Actions: * If pain score is 4 or above: No action needed, pain <4.  1. Have you developed a fever since your procedure? no  2.   Have you had an respiratory symptoms (SOB or cough) since your procedure? no  3.   Have you tested positive for COVID 19 since your procedure no  4.   Have you had any family members/close contacts diagnosed with the COVID 19 since your procedure?  no   If yes to any of these questions please route to Joylene John, RN and Erenest Rasher, RN

## 2019-09-26 NOTE — Telephone Encounter (Signed)
  Follow up Call-  Call back number 09/24/2019  Post procedure Call Back phone  # [phone]  Permission to leave phone message Yes  Some recent data might be hidden    No answer, voicemail didn't come on

## 2019-09-28 ENCOUNTER — Encounter: Payer: Self-pay | Admitting: Gastroenterology

## 2019-11-11 ENCOUNTER — Other Ambulatory Visit: Payer: Self-pay | Admitting: Family Medicine

## 2020-02-04 ENCOUNTER — Other Ambulatory Visit: Payer: Self-pay | Admitting: Emergency Medicine

## 2020-02-04 DIAGNOSIS — Z1231 Encounter for screening mammogram for malignant neoplasm of breast: Secondary | ICD-10-CM

## 2020-02-06 ENCOUNTER — Encounter: Payer: Self-pay | Admitting: Family Medicine

## 2020-02-08 ENCOUNTER — Ambulatory Visit (INDEPENDENT_AMBULATORY_CARE_PROVIDER_SITE_OTHER): Payer: Commercial Managed Care - PPO | Admitting: Family Medicine

## 2020-02-08 ENCOUNTER — Encounter: Payer: Self-pay | Admitting: Family Medicine

## 2020-02-08 ENCOUNTER — Other Ambulatory Visit: Payer: Self-pay

## 2020-02-08 VITALS — BP 98/66 | HR 72 | Temp 98.1°F | Resp 14 | Ht 60.0 in | Wt 100.4 lb

## 2020-02-08 DIAGNOSIS — Z23 Encounter for immunization: Secondary | ICD-10-CM

## 2020-02-08 DIAGNOSIS — E039 Hypothyroidism, unspecified: Secondary | ICD-10-CM

## 2020-02-08 DIAGNOSIS — Z0000 Encounter for general adult medical examination without abnormal findings: Secondary | ICD-10-CM

## 2020-02-08 NOTE — Progress Notes (Signed)
   Subjective:    Patient ID: Sherri Charles, female    DOB: [birth year], 54 y.o.   MRN: [id number]  HPI Here for a well exam. She feels well.    Review of Systems  Constitutional: Negative.   HENT: Negative.   Eyes: Negative.   Respiratory: Negative.   Cardiovascular: Negative.   Gastrointestinal: Negative.   Genitourinary: Negative for decreased urine volume, difficulty urinating, dyspareunia, dysuria, enuresis, flank pain, frequency, hematuria, pelvic pain and urgency.  Musculoskeletal: Negative.   Skin: Negative.   Neurological: Negative.   Psychiatric/Behavioral: Negative.        Objective:   Physical Exam Constitutional:      General: She is not in acute distress.    Appearance: She is well-developed.  HENT:     Head: Normocephalic and atraumatic.     Right Ear: External ear normal.     Left Ear: External ear normal.     Nose: Nose normal.     Mouth/Throat:     Pharynx: No oropharyngeal exudate.  Eyes:     General: No scleral icterus.    Conjunctiva/sclera: Conjunctivae normal.     Pupils: Pupils are equal, round, and reactive to light.  Neck:     Thyroid: No thyromegaly.     Vascular: No JVD.  Cardiovascular:     Rate and Rhythm: Normal rate and regular rhythm.     Heart sounds: Normal heart sounds. No murmur heard.  No friction rub. No gallop.   Pulmonary:     Effort: Pulmonary effort is normal. No respiratory distress.     Breath sounds: Normal breath sounds. No wheezing or rales.  Chest:     Chest wall: No tenderness.  Abdominal:     General: Bowel sounds are normal. There is no distension.     Palpations: Abdomen is soft. There is no mass.     Tenderness: There is no abdominal tenderness. There is no guarding or rebound.  Musculoskeletal:        General: No tenderness. Normal range of motion.     Cervical back: Normal range of motion and neck supple.  Lymphadenopathy:     Cervical: No cervical adenopathy.  Skin:    General: Skin is warm and dry.      Findings: No erythema or rash.  Neurological:     Mental Status: She is alert and oriented to person, place, and time.     Cranial Nerves: No cranial nerve deficit.     Motor: No abnormal muscle tone.     Coordination: Coordination normal.     Deep Tendon Reflexes: Reflexes are normal and symmetric. Reflexes normal.  Psychiatric:        Behavior: Behavior normal.        Thought Content: Thought content normal.        Judgment: Judgment normal.           Assessment & Plan:  Well exam. We discussed diet and exercise. Get fasting labs.  Alysia Penna, MD

## 2020-02-08 NOTE — Addendum Note (Signed)
Addended by: Patrcia Dolly on: 02/08/2020 09:10 AM   Modules accepted: Orders

## 2020-02-08 NOTE — Patient Instructions (Signed)
° ° ° °  If you have lab work done today you will be contacted with your lab results within the next 2 weeks.  If you have not heard from us then please contact us. The fastest way to get your results is to register for My Chart. ° ° °IF you received an x-ray today, you will receive an invoice from [facility]. Please contact [facility] at [phone] with questions or concerns regarding your invoice.  ° °IF you received labwork today, you will receive an invoice from [facility]. Please contact [facility] at [phone] with questions or concerns regarding your invoice.  ° °Our billing staff will not be able to assist you with questions regarding bills from these companies. ° °You will be contacted with the lab results as soon as they are available. The fastest way to get your results is to activate your My Chart account. Instructions are located on the last page of this paperwork. If you have not heard from us regarding the results in 2 weeks, please contact this office. °  ° ° ° °

## 2020-02-08 NOTE — Addendum Note (Signed)
Addended by: Marrion Coy on: 02/08/2020 09:32 AM   Modules accepted: Orders

## 2020-02-09 LAB — CBC WITH DIFFERENTIAL/PLATELET
Absolute Monocytes: 265 cells/uL (ref 200–950)
Basophils Absolute: 29 cells/uL (ref 0–200)
Basophils Relative: 0.6 %
Eosinophils Absolute: 39 cells/uL (ref 15–500)
Eosinophils Relative: 0.8 %
HCT: 39.5 % (ref 35.0–45.0)
Hemoglobin: 13.1 g/dL (ref 11.7–15.5)
Lymphs Abs: 1161 cells/uL (ref 850–3900)
MCH: 30.8 pg (ref 27.0–33.0)
MCHC: 33.2 g/dL (ref 32.0–36.0)
MCV: 92.9 fL (ref 80.0–100.0)
MPV: 10.7 fL (ref 7.5–12.5)
Monocytes Relative: 5.4 %
Neutro Abs: 3406 cells/uL (ref 1500–7800)
Neutrophils Relative %: 69.5 %
Platelets: 237 10*3/uL (ref 140–400)
RBC: 4.25 10*6/uL (ref 3.80–5.10)
RDW: 14.1 % (ref 11.0–15.0)
Total Lymphocyte: 23.7 %
WBC: 4.9 10*3/uL (ref 3.8–10.8)

## 2020-02-09 LAB — HEPATIC FUNCTION PANEL
AG Ratio: 1.9 (calc) (ref 1.0–2.5)
ALT: 14 U/L (ref 6–29)
AST: 20 U/L (ref 10–35)
Albumin: 4.5 g/dL (ref 3.6–5.1)
Alkaline phosphatase (APISO): 88 U/L (ref 37–153)
Bilirubin, Direct: 0.1 mg/dL (ref 0.0–0.2)
Globulin: 2.4 g/dL (calc) (ref 1.9–3.7)
Indirect Bilirubin: 0.4 mg/dL (calc) (ref 0.2–1.2)
Total Bilirubin: 0.5 mg/dL (ref 0.2–1.2)
Total Protein: 6.9 g/dL (ref 6.1–8.1)

## 2020-02-09 LAB — BASIC METABOLIC PANEL WITH GFR
BUN: 18 mg/dL (ref 7–25)
CO2: 27 mmol/L (ref 20–32)
Calcium: 10.3 mg/dL (ref 8.6–10.4)
Chloride: 103 mmol/L (ref 98–110)
Creat: 0.82 mg/dL (ref 0.50–1.05)
GFR, Est African American: 93 mL/min/{1.73_m2} (ref >=60)
GFR, Est Non African American: 81 mL/min/{1.73_m2} (ref >=60)
Glucose, Bld: 91 mg/dL (ref 65–99)
Potassium: 4.3 mmol/L (ref 3.5–5.3)
Sodium: 139 mmol/L (ref 135–146)

## 2020-02-09 LAB — LIPID PANEL
Cholesterol: 237 mg/dL — ABNORMAL HIGH (ref <200)
HDL: 107 mg/dL (ref >=50)
LDL Cholesterol (Calc): 114 mg/dL (calc) — ABNORMAL HIGH
Non-HDL Cholesterol (Calc): 130 mg/dL (calc) — ABNORMAL HIGH (ref <130)
Total CHOL/HDL Ratio: 2.2 (calc) (ref <5.0)
Triglycerides: 68 mg/dL (ref <150)

## 2020-02-09 LAB — TSH: TSH: 12.52 mIU/L — ABNORMAL HIGH

## 2020-02-09 LAB — T4, FREE: Free T4: 1.2 ng/dL (ref 0.8–1.8)

## 2020-02-09 LAB — T3, FREE: T3, Free: 2.1 pg/mL — ABNORMAL LOW (ref 2.3–4.2)

## 2020-02-12 ENCOUNTER — Ambulatory Visit
Admission: RE | Admit: 2020-02-12 | Discharge: 2020-02-12 | Disposition: A | Discharge status: Home / Self Care | Payer: Commercial Managed Care - PPO | Source: Ambulatory Visit

## 2020-02-12 ENCOUNTER — Other Ambulatory Visit: Payer: Self-pay

## 2020-02-12 DIAGNOSIS — Z1231 Encounter for screening mammogram for malignant neoplasm of breast: Secondary | ICD-10-CM

## 2020-02-13 ENCOUNTER — Other Ambulatory Visit: Payer: Self-pay

## 2020-02-13 ENCOUNTER — Telehealth: Payer: Self-pay | Admitting: Family Medicine

## 2020-02-13 ENCOUNTER — Telehealth: Payer: Self-pay

## 2020-02-13 MED ORDER — LEVOTHYROXINE SODIUM 100 MCG PO TABS
100.0000 ug | ORAL_TABLET | Freq: Every morning | ORAL | 3 refills | Status: DC
Start: 2020-02-13 — End: 2020-06-03

## 2020-02-13 NOTE — Telephone Encounter (Signed)
Health screening form filled out/signed/faxed to [facility] @ [phone].  Dm/cma

## 2020-02-13 NOTE — Telephone Encounter (Signed)
Patient is calling back for lab results, please advise. CB is [phone]

## 2020-02-13 NOTE — Telephone Encounter (Signed)
Patient notified VIA phone and Rx sent to the pharmacy. Dm/cma

## 2020-03-06 ENCOUNTER — Encounter: Payer: Self-pay | Admitting: Family Medicine

## 2020-03-06 DIAGNOSIS — M8580 Other specified disorders of bone density and structure, unspecified site: Secondary | ICD-10-CM

## 2020-03-07 NOTE — Telephone Encounter (Signed)
I put in the order

## 2020-03-10 ENCOUNTER — Telehealth: Payer: Self-pay | Admitting: Family Medicine

## 2020-03-10 NOTE — Telephone Encounter (Signed)
Pt is calling in stating that she would like to have her TSH checked as well due to her being on TSH medication and it may need to be adjusted before she gets her refill.

## 2020-03-10 NOTE — Telephone Encounter (Signed)
Okay to add TSH test?

## 2020-03-12 ENCOUNTER — Other Ambulatory Visit: Payer: Self-pay

## 2020-03-12 DIAGNOSIS — E039 Hypothyroidism, unspecified: Secondary | ICD-10-CM

## 2020-03-12 NOTE — Telephone Encounter (Signed)
It is too soon to check the thyroid levels (we had planned to do this 3 months after the change), so we will do this in February

## 2020-03-12 NOTE — Telephone Encounter (Signed)
I spoke with pt. She was told to repeat the tsh in one month. I advised her that we have to wait until the 3 month mark to see if the medication is working, and her medication will be refilled so she doesn't have to worry about running out. Pt verbalized understanding,  lab appt made for 2/21 at 1pm for both TSH & Vitamin D. Rx was sent in 11/17 for 90 with 3 rf, advised pt to let me know if pharmacy doesn't have Rx and we can resend.

## 2020-03-13 ENCOUNTER — Other Ambulatory Visit: Payer: Commercial Managed Care - PPO

## 2020-05-16 ENCOUNTER — Other Ambulatory Visit: Payer: Self-pay

## 2020-05-19 ENCOUNTER — Other Ambulatory Visit: Payer: Self-pay

## 2020-05-19 ENCOUNTER — Other Ambulatory Visit (INDEPENDENT_AMBULATORY_CARE_PROVIDER_SITE_OTHER): Payer: Commercial Managed Care - PPO

## 2020-05-19 DIAGNOSIS — M8580 Other specified disorders of bone density and structure, unspecified site: Secondary | ICD-10-CM | POA: Diagnosis not present

## 2020-05-19 DIAGNOSIS — E039 Hypothyroidism, unspecified: Secondary | ICD-10-CM

## 2020-05-19 LAB — VITAMIN D 25 HYDROXY (VIT D DEFICIENCY, FRACTURES): VITD: 67.3 ng/mL (ref 30.00–100.00)

## 2020-05-19 LAB — TSH: TSH: 0.02 u[IU]/mL — ABNORMAL LOW (ref 0.35–4.50)

## 2020-05-23 ENCOUNTER — Encounter: Payer: Self-pay | Admitting: Family Medicine

## 2020-05-23 DIAGNOSIS — E039 Hypothyroidism, unspecified: Secondary | ICD-10-CM

## 2020-05-23 NOTE — Telephone Encounter (Signed)
Let's check this more carefully. Have her come back to the lab for a full thyroid panel. I will put in the orders

## 2020-05-30 ENCOUNTER — Other Ambulatory Visit: Payer: Commercial Managed Care - PPO

## 2020-05-30 ENCOUNTER — Other Ambulatory Visit: Payer: Self-pay

## 2020-05-30 DIAGNOSIS — E039 Hypothyroidism, unspecified: Secondary | ICD-10-CM

## 2020-05-31 LAB — TSH: TSH: 0.01 mIU/L — ABNORMAL LOW

## 2020-05-31 LAB — T3, FREE: T3, Free: 3.8 pg/mL (ref 2.3–4.2)

## 2020-05-31 LAB — T4, FREE: Free T4: 2.4 ng/dL — ABNORMAL HIGH (ref 0.8–1.8)

## 2020-06-03 ENCOUNTER — Other Ambulatory Visit: Payer: Self-pay

## 2020-06-03 MED ORDER — LEVOTHYROXINE SODIUM 88 MCG PO TABS
88.0000 ug | ORAL_TABLET | Freq: Every day | ORAL | 3 refills | Status: DC
Start: 2020-06-03 — End: 2020-08-06

## 2020-06-19 ENCOUNTER — Other Ambulatory Visit: Payer: Self-pay

## 2020-06-20 ENCOUNTER — Ambulatory Visit (INDEPENDENT_AMBULATORY_CARE_PROVIDER_SITE_OTHER): Payer: Commercial Managed Care - PPO | Admitting: Family Medicine

## 2020-06-20 ENCOUNTER — Encounter: Payer: Self-pay | Admitting: Family Medicine

## 2020-06-20 VITALS — BP 110/62 | HR 78 | Temp 98.6°F | Wt 102.8 lb

## 2020-06-20 DIAGNOSIS — R221 Localized swelling, mass and lump, neck: Secondary | ICD-10-CM | POA: Diagnosis not present

## 2020-06-20 DIAGNOSIS — E039 Hypothyroidism, unspecified: Secondary | ICD-10-CM | POA: Diagnosis not present

## 2020-06-20 NOTE — Progress Notes (Signed)
   Subjective:    Patient ID: Sherri Charles, female    DOB: 03/16/1966, 55 y.o.   MRN: [id number]  HPI Here for recurrent swelling in the right neck area. This started about 6 months ago, but it has become more frequent. This latest episode started about 2 weeks ago, and the selling has been persistent in that time. She feels a lump in the throat on the right side that can be mildly painful at times. It makes it difficult to swallow food. No ST or voice changes. No weight changes. We decreased the dose of her Levothyroxine recently. She is S/P a thyroidectomy.    Review of Systems  Constitutional: Negative.   HENT: Positive for trouble swallowing. Negative for sore throat and voice change.   Eyes: Negative.   Respiratory: Negative.   Cardiovascular: Negative.        Objective:   Physical Exam Constitutional:      Appearance: Normal appearance. She is not ill-appearing.  HENT:     Right Ear: Tympanic membrane, ear canal and external ear normal.     Left Ear: Tympanic membrane, ear canal and external ear normal.     Nose: Nose normal.     Mouth/Throat:     Pharynx: Oropharynx is clear.  Eyes:     Conjunctiva/sclera: Conjunctivae normal.  Neck:     Vascular: No carotid bruit.     Comments: There is a 2 cm X 1 cm firm mobile mass in the right anterior neck  Musculoskeletal:     Cervical back: Normal range of motion.  Neurological:     Mental Status: She is alert.           Assessment & Plan:  Neck mass. We obtained thyroid levels about 3 weeks ago and we reduced the Levothyroxine dose to 88 mcg daily. We will set up a contrasted CT of the neck soon to evaluate this mass further.  Alysia Penna, MD

## 2020-06-21 LAB — BASIC METABOLIC PANEL
BUN: 17 mg/dL (ref 7–25)
CO2: 28 mmol/L (ref 20–32)
Calcium: 9.8 mg/dL (ref 8.6–10.4)
Chloride: 105 mmol/L (ref 98–110)
Creat: 0.87 mg/dL (ref 0.50–1.05)
Glucose, Bld: 108 mg/dL — ABNORMAL HIGH (ref 65–99)
Potassium: 4 mmol/L (ref 3.5–5.3)
Sodium: 142 mmol/L (ref 135–146)

## 2020-06-30 ENCOUNTER — Ambulatory Visit (INDEPENDENT_AMBULATORY_CARE_PROVIDER_SITE_OTHER)
Admission: RE | Admit: 2020-06-30 | Discharge: 2020-06-30 | Disposition: A | Discharge status: Home / Self Care | Payer: Commercial Managed Care - PPO | Source: Ambulatory Visit | Attending: Family Medicine | Admitting: Family Medicine

## 2020-06-30 ENCOUNTER — Other Ambulatory Visit: Payer: Self-pay

## 2020-06-30 DIAGNOSIS — R221 Localized swelling, mass and lump, neck: Secondary | ICD-10-CM

## 2020-06-30 MED ORDER — IOHEXOL 300 MG/ML  SOLN
75.0000 mL | Freq: Once | INTRAMUSCULAR | Status: AC | PRN
  Administered 2020-06-30: 75 mL via INTRAVENOUS

## 2020-07-01 ENCOUNTER — Encounter: Payer: Self-pay | Admitting: Family Medicine

## 2020-07-01 DIAGNOSIS — R221 Localized swelling, mass and lump, neck: Secondary | ICD-10-CM

## 2020-07-02 NOTE — Telephone Encounter (Signed)
The referral was done  

## 2020-08-06 ENCOUNTER — Encounter: Payer: Self-pay | Admitting: Family Medicine

## 2020-08-06 MED ORDER — SYNTHROID 88 MCG PO TABS: 88.0000 ug | ORAL_TABLET | Freq: Every day | ORAL | 11 refills | Status: DC

## 2020-08-06 NOTE — Telephone Encounter (Signed)
I doubt her skin is reacting to the Levothyroxine, but she can try taking brand name Synthroid instead. I have sent a prescription for this to her pharmacy to try for a month or so

## 2020-10-20 ENCOUNTER — Encounter: Payer: Self-pay | Admitting: Family Medicine

## 2020-10-22 ENCOUNTER — Other Ambulatory Visit: Payer: Self-pay

## 2020-10-22 DIAGNOSIS — E039 Hypothyroidism, unspecified: Secondary | ICD-10-CM

## 2020-10-27 ENCOUNTER — Other Ambulatory Visit: Payer: Self-pay

## 2020-10-27 ENCOUNTER — Other Ambulatory Visit (INDEPENDENT_AMBULATORY_CARE_PROVIDER_SITE_OTHER): Payer: Commercial Managed Care - PPO

## 2020-10-27 DIAGNOSIS — E039 Hypothyroidism, unspecified: Secondary | ICD-10-CM | POA: Diagnosis not present

## 2020-10-28 ENCOUNTER — Other Ambulatory Visit: Payer: Self-pay

## 2020-10-28 LAB — T3, FREE: T3, Free: 2.5 pg/mL (ref 2.0–4.4)

## 2020-10-28 LAB — T4, FREE: Free T4: 2.02 ng/dL — ABNORMAL HIGH (ref 0.82–1.77)

## 2020-10-28 LAB — TSH: TSH: 0.021 u[IU]/mL — ABNORMAL LOW (ref 0.450–4.500)

## 2020-10-28 MED ORDER — SYNTHROID 75 MCG PO TABS: ORAL_TABLET | ORAL | 3 refills | Status: DC

## 2020-12-11 ENCOUNTER — Ambulatory Visit (INDEPENDENT_AMBULATORY_CARE_PROVIDER_SITE_OTHER): Payer: Commercial Managed Care - PPO | Admitting: Orthopaedic Surgery

## 2020-12-11 ENCOUNTER — Other Ambulatory Visit: Payer: Self-pay

## 2020-12-11 ENCOUNTER — Encounter: Payer: Self-pay | Admitting: Orthopaedic Surgery

## 2020-12-11 ENCOUNTER — Ambulatory Visit: Payer: Self-pay

## 2020-12-11 DIAGNOSIS — M79672 Pain in left foot: Secondary | ICD-10-CM | POA: Diagnosis not present

## 2020-12-11 NOTE — Progress Notes (Signed)
Office Visit Note   Patient: Sherri Charles           Date of Birth: [birth year]           MRN: [id number] Visit Date: 12/11/2020              Requested by: Sherri Morale, MD [place],  [place] [id number] PCP: Sherri Morale, MD   Assessment & Charles: Visit Diagnoses:  1. Pain in left foot     Charles: Sherri Charles missed a step with her left foot 2 days ago and has been experiencing pain and swelling and ecchymosis along the lateral aspect of her left foot.  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  X-rays were not consistent with a fracture.  She is swollen and tender over the lateral aspect of the midfoot and base of the fifth metatarsal.  We will try an equalizer boot and let her bear weight to tolerance and reevaluate in 2 weeks.  I would consider repeat films if she still having a problem.  Exam and history is consistent with soft tissue injury  Follow-Up Instructions: Return in about 2 weeks (around 12/25/2020).   Orders:  Orders Placed This Encounter  Procedures   XR Foot Complete Left   No orders of the defined types were placed in this encounter.     Procedures: No procedures performed   Clinical Data: No additional findings.   Subjective: Chief Complaint  Patient presents with   Left Foot - Pain, Injury    DOI 12/09/2020  Patient presents today for left foot pain. She states that she missed a step and fell onto her foot Tuesday 12/09/2020. She had immediate pain. She has pain at the top and bottom of her midfoot, but worse at the bottom. She states that it has been swollen. She is wearing wooden shoe and using crutches. She has taken only a few doses of Tylenol.   HPI  Review of Systems   Objective: Vital Signs: There were no vitals taken for this visit.  Physical Exam Constitutional:      Appearance: She is  well-developed.  Eyes:     Pupils: Pupils are equal, round, and reactive to light.  Pulmonary:     Effort: Pulmonary effort is normal.  Skin:    General: Skin is warm and dry.  Neurological:     Mental Status: She is alert and oriented to person, place, and time.  Psychiatric:        Behavior: Behavior normal.    Ortho Exam awake alert and oriented x3.  Comfortable sitting left foot was examined with tenderness diffusely along the cuboid base of the fifth metatarsal with moderate swelling and some early ecchymosis.  No ankle pain.  No toe pain.  No pain on plantar aspect of her foot.  No obvious deformity motor and sensory exam intact  Specialty Comments:  No specialty comments available.  Imaging: XR Foot Complete Left  Result Date: 12/11/2020 Films of the left foot were obtained in 3 projections.  Patient had an injury 2 days ago on his EXTR.  Some pain along the lateral midfoot.  No obvious fracture or subluxation.  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   Dysphagia, pharyngoesophageal phase 04/05/2014   History of colonic polyps 10/14/2011   Nontoxic multinodular goiter 08/24/2010   Osteopenia 09/27/2007   GERD 12/08/2006   Past Medical History:  Diagnosis Date   Dry eyes    sees Sherri Charles   GERD (gastroesophageal reflux disease)    Hiatal hernia    IBS (irritable bowel syndrome)    Infertility management    sees Sherri Charles   Osteopenia    per DEXA 09-28-07   Overactive bladder    sees Sherri Charles   PONV (postoperative nausea and vomiting)    Positive ANA (antinuclear antibody)    Thyroid disease     Family History  Problem Relation Age of Onset   Alcohol abuse Mother    Heart disease Mother    Arthritis Father    Hypertension Father    Stomach cancer Paternal  Grandmother    Ovarian cancer Paternal Aunt    Breast cancer Paternal Aunt    Colon cancer Neg Hx    Colon polyps Neg Hx    Esophageal cancer Neg Hx    Rectal cancer Neg Hx     Past Surgical History:  Procedure Laterality Date   ABDOMINAL HYSTERECTOMY  2009   TAH and left SO per Sherri Charles   CERVIX SURGERY     lesion removed from cervix   COLONOSCOPY  09/24/2019   per Sherri Charles, adenomatous polyp, repeat in 7 yrs    ESOPHAGOGASTRODUODENOSCOPY  04/09/2014   per Sherri Charles, esophagitis, performed dilation    HERNIA REPAIR     right inguinal    LAPAROSCOPY     x 2   THYROIDECTOMY N/A 08/29/2014   Procedure: TOTAL THYROIDECTOMY;  Surgeon: Sherri Gemma, MD;  Location: [facility];  Service: General;  Laterality: N/A;   UPPER GASTROINTESTINAL ENDOSCOPY     URETHRAL DILATION     x two, (last 5-10)   Social History   Occupational History   Occupation: Scientist, research (medical): [employer]  Tobacco Use   Smoking status: Never   Smokeless tobacco: Never  Vaping Use   Vaping Use: Never used  Substance and Sexual Activity   Alcohol use: Yes    Alcohol/week: 0.0 standard drinks    Comment: occ   Drug use: No   Sexual activity: Never    Birth control/protection: Abstinence

## 2021-01-29 ENCOUNTER — Telehealth: Payer: Self-pay | Admitting: Family Medicine

## 2021-01-29 DIAGNOSIS — E039 Hypothyroidism, unspecified: Secondary | ICD-10-CM

## 2021-01-29 NOTE — Telephone Encounter (Signed)
Patient called because she was need lab orders to check thyroid. Patient states she needs to have levels tested to see if she needs to get the refill on her thyroid medication. Patient states she and Dr.Fry did discuss this previously. I did let patient know Dr.Fry is out of the office.     Good callback number is [phone]     Please advise

## 2021-02-02 ENCOUNTER — Other Ambulatory Visit: Payer: Self-pay | Admitting: Family Medicine

## 2021-02-02 ENCOUNTER — Encounter: Payer: Self-pay | Admitting: Family Medicine

## 2021-02-02 DIAGNOSIS — Z1231 Encounter for screening mammogram for malignant neoplasm of breast: Secondary | ICD-10-CM

## 2021-02-02 NOTE — Telephone Encounter (Signed)
I put in the orders so she can schedule the lab draw

## 2021-02-02 NOTE — Telephone Encounter (Signed)
Patient sent my chart message inquiring about message.  Reply was sent to patient via my chart with reply from Dr. Sarajane Jews.

## 2021-02-03 ENCOUNTER — Other Ambulatory Visit: Payer: Self-pay

## 2021-02-03 ENCOUNTER — Other Ambulatory Visit (INDEPENDENT_AMBULATORY_CARE_PROVIDER_SITE_OTHER): Payer: Commercial Managed Care - PPO

## 2021-02-03 DIAGNOSIS — E039 Hypothyroidism, unspecified: Secondary | ICD-10-CM

## 2021-02-04 ENCOUNTER — Encounter: Payer: Self-pay | Admitting: Family Medicine

## 2021-02-04 LAB — T3, FREE: T3, Free: 2.4 pg/mL (ref 2.3–4.2)

## 2021-02-04 LAB — T4, FREE: Free T4: 1.38 ng/dL (ref 0.60–1.60)

## 2021-02-04 LAB — TSH: TSH: 0.1 u[IU]/mL — ABNORMAL LOW (ref 0.35–5.50)

## 2021-02-05 NOTE — Telephone Encounter (Signed)
See my Result Note. Stay on the same dose of Synthroid. She already has refills available

## 2021-02-06 NOTE — Telephone Encounter (Signed)
Message sent to pt via My Chart

## 2021-02-17 ENCOUNTER — Encounter: Payer: Commercial Managed Care - PPO | Admitting: Family Medicine

## 2021-03-05 ENCOUNTER — Ambulatory Visit
Admission: RE | Admit: 2021-03-05 | Discharge: 2021-03-05 | Disposition: A | Discharge status: Home / Self Care | Payer: Commercial Managed Care - PPO | Source: Ambulatory Visit

## 2021-03-05 ENCOUNTER — Other Ambulatory Visit: Payer: Self-pay | Admitting: Emergency Medicine

## 2021-03-05 DIAGNOSIS — Z1231 Encounter for screening mammogram for malignant neoplasm of breast: Secondary | ICD-10-CM

## 2021-03-17 ENCOUNTER — Ambulatory Visit (INDEPENDENT_AMBULATORY_CARE_PROVIDER_SITE_OTHER): Payer: Commercial Managed Care - PPO | Admitting: Family Medicine

## 2021-03-17 ENCOUNTER — Encounter: Payer: Self-pay | Admitting: Family Medicine

## 2021-03-17 VITALS — BP 99/60 | HR 63 | Temp 98.0°F | Ht 60.25 in | Wt 99.0 lb

## 2021-03-17 DIAGNOSIS — Z8249 Family history of ischemic heart disease and other diseases of the circulatory system: Secondary | ICD-10-CM

## 2021-03-17 DIAGNOSIS — Z0000 Encounter for general adult medical examination without abnormal findings: Secondary | ICD-10-CM | POA: Diagnosis not present

## 2021-03-17 DIAGNOSIS — E039 Hypothyroidism, unspecified: Secondary | ICD-10-CM | POA: Diagnosis not present

## 2021-03-17 LAB — BASIC METABOLIC PANEL
BUN: 12 mg/dL (ref 6–23)
CO2: 30 mEq/L (ref 19–32)
Calcium: 10.1 mg/dL (ref 8.4–10.5)
Chloride: 103 mEq/L (ref 96–112)
Creatinine, Ser: 0.8 mg/dL (ref 0.40–1.20)
GFR: 82.52 mL/min (ref >60.00)
Glucose, Bld: 89 mg/dL (ref 70–99)
Potassium: 3.9 mEq/L (ref 3.5–5.1)
Sodium: 140 mEq/L (ref 135–145)

## 2021-03-17 LAB — HEPATIC FUNCTION PANEL
ALT: 15 U/L (ref 0–35)
AST: 23 U/L (ref 0–37)
Albumin: 4.4 g/dL (ref 3.5–5.2)
Alkaline Phosphatase: 94 U/L (ref 39–117)
Bilirubin, Direct: 0.1 mg/dL (ref 0.0–0.3)
Total Bilirubin: 0.4 mg/dL (ref 0.2–1.2)
Total Protein: 7.4 g/dL (ref 6.0–8.3)

## 2021-03-17 LAB — LIPID PANEL
Cholesterol: 174 mg/dL (ref 0–200)
HDL: 79 mg/dL (ref >39.00)
LDL Cholesterol: 79 mg/dL (ref 0–99)
NonHDL: 94.54
Total CHOL/HDL Ratio: 2
Triglycerides: 78 mg/dL (ref 0.0–149.0)
VLDL: 15.6 mg/dL (ref 0.0–40.0)

## 2021-03-17 LAB — CBC WITH DIFFERENTIAL/PLATELET
Basophils Absolute: 0 10*3/uL (ref 0.0–0.1)
Basophils Relative: 0.8 % (ref 0.0–3.0)
Eosinophils Absolute: 0 10*3/uL (ref 0.0–0.7)
Eosinophils Relative: 0.9 % (ref 0.0–5.0)
HCT: 40.5 % (ref 36.0–46.0)
Hemoglobin: 13.4 g/dL (ref 12.0–15.0)
Lymphocytes Relative: 32.2 % (ref 12.0–46.0)
Lymphs Abs: 1.1 10*3/uL (ref 0.7–4.0)
MCHC: 33.1 g/dL (ref 30.0–36.0)
MCV: 90.5 fl (ref 78.0–100.0)
Monocytes Absolute: 0.2 10*3/uL (ref 0.1–1.0)
Monocytes Relative: 4.9 % (ref 3.0–12.0)
Neutro Abs: 2 10*3/uL (ref 1.4–7.7)
Neutrophils Relative %: 61.2 % (ref 43.0–77.0)
Platelets: 212 10*3/uL (ref 150.0–400.0)
RBC: 4.47 Mil/uL (ref 3.87–5.11)
RDW: 14.4 % (ref 11.5–15.5)
WBC: 3.3 10*3/uL — ABNORMAL LOW (ref 4.0–10.5)

## 2021-03-17 LAB — HEMOGLOBIN A1C: Hgb A1c MFr Bld: 6 % (ref 4.6–6.5)

## 2021-03-17 LAB — T4, FREE: Free T4: 1.47 ng/dL (ref 0.60–1.60)

## 2021-03-17 LAB — T3, FREE: T3, Free: 2.8 pg/mL (ref 2.3–4.2)

## 2021-03-17 LAB — TSH: TSH: 0.17 u[IU]/mL — ABNORMAL LOW (ref 0.35–5.50)

## 2021-03-17 NOTE — Addendum Note (Signed)
Addended by: Alysia Penna A on: 03/17/2021 05:09 PM   Modules accepted: Orders

## 2021-03-17 NOTE — Progress Notes (Signed)
° °  Subjective:    Patient ID: Sherri Charles, female    DOB: June 22, 1965, 55 y.o.   MRN: [id number]  HPI Here for a well exam. She feels fine.    Review of Systems  Constitutional: Negative.   HENT: Negative.    Eyes: Negative.   Respiratory: Negative.    Cardiovascular: Negative.   Gastrointestinal: Negative.   Genitourinary:  Negative for decreased urine volume, difficulty urinating, dyspareunia, dysuria, enuresis, flank pain, frequency, hematuria, pelvic pain and urgency.  Musculoskeletal: Negative.   Skin: Negative.   Neurological: Negative.  Negative for headaches.  Psychiatric/Behavioral: Negative.        Objective:   Physical Exam Constitutional:      General: She is not in acute distress.    Appearance: Normal appearance. She is well-developed.  HENT:     Head: Normocephalic and atraumatic.     Right Ear: External ear normal.     Left Ear: External ear normal.     Nose: Nose normal.     Mouth/Throat:     Pharynx: No oropharyngeal exudate.  Eyes:     General: No scleral icterus.    Conjunctiva/sclera: Conjunctivae normal.     Pupils: Pupils are equal, round, and reactive to light.  Neck:     Thyroid: No thyromegaly.     Vascular: No JVD.  Cardiovascular:     Rate and Rhythm: Normal rate and regular rhythm.     Heart sounds: Normal heart sounds. No murmur heard.   No friction rub. No gallop.  Pulmonary:     Effort: Pulmonary effort is normal. No respiratory distress.     Breath sounds: Normal breath sounds. No wheezing or rales.  Chest:     Chest wall: No tenderness.  Abdominal:     General: Bowel sounds are normal. There is no distension.     Palpations: Abdomen is soft. There is no mass.     Tenderness: There is no abdominal tenderness. There is no guarding or rebound.  Musculoskeletal:        General: No tenderness. Normal range of motion.     Cervical back: Normal range of motion and neck supple.  Lymphadenopathy:     Cervical: No cervical adenopathy.   Skin:    General: Skin is warm and dry.     Findings: No erythema or rash.  Neurological:     Mental Status: She is alert and oriented to person, place, and time.     Cranial Nerves: No cranial nerve deficit.     Motor: No abnormal muscle tone.     Coordination: Coordination normal.     Deep Tendon Reflexes: Reflexes are normal and symmetric. Reflexes normal.  Psychiatric:        Behavior: Behavior normal.        Thought Content: Thought content normal.        Judgment: Judgment normal.          Assessment & Plan:  Well exam. We discussed diet and exercise. Get fasting labs.  Alysia Penna, MD

## 2021-03-18 ENCOUNTER — Encounter: Payer: Self-pay | Admitting: Family Medicine

## 2021-04-22 ENCOUNTER — Encounter: Payer: Self-pay | Admitting: Family Medicine

## 2021-04-23 NOTE — Telephone Encounter (Signed)
Please check on this form

## 2021-04-24 ENCOUNTER — Ambulatory Visit (INDEPENDENT_AMBULATORY_CARE_PROVIDER_SITE_OTHER)
Admission: RE | Admit: 2021-04-24 | Discharge: 2021-04-24 | Disposition: A | Discharge status: Home / Self Care | Payer: Self-pay | Source: Ambulatory Visit | Attending: Family Medicine | Admitting: Family Medicine

## 2021-04-24 ENCOUNTER — Other Ambulatory Visit: Payer: Self-pay

## 2021-04-24 DIAGNOSIS — Z8249 Family history of ischemic heart disease and other diseases of the circulatory system: Secondary | ICD-10-CM

## 2021-04-28 ENCOUNTER — Telehealth: Payer: Self-pay | Admitting: Family Medicine

## 2021-04-28 NOTE — Telephone Encounter (Signed)
Patient returned Sherri Charles's call.

## 2021-04-28 NOTE — Telephone Encounter (Signed)
Patient viewed results message on My Chart

## 2021-05-18 NOTE — Telephone Encounter (Signed)
Pt form was completed and faxed as requested, pt notified

## 2021-10-26 ENCOUNTER — Other Ambulatory Visit: Payer: Self-pay | Admitting: Family Medicine

## 2021-10-26 DIAGNOSIS — E039 Hypothyroidism, unspecified: Secondary | ICD-10-CM

## 2022-01-25 ENCOUNTER — Other Ambulatory Visit: Payer: Self-pay | Admitting: Family Medicine

## 2022-01-25 DIAGNOSIS — E039 Hypothyroidism, unspecified: Secondary | ICD-10-CM

## 2022-02-09 ENCOUNTER — Other Ambulatory Visit: Payer: Self-pay | Admitting: Emergency Medicine

## 2022-02-09 DIAGNOSIS — Z1231 Encounter for screening mammogram for malignant neoplasm of breast: Secondary | ICD-10-CM

## 2022-02-23 ENCOUNTER — Encounter: Payer: Self-pay | Admitting: Family Medicine

## 2022-02-23 NOTE — Telephone Encounter (Signed)
We can wait to discuss this at her appt

## 2022-03-16 ENCOUNTER — Ambulatory Visit
Admission: RE | Admit: 2022-03-16 | Discharge: 2022-03-16 | Disposition: A | Discharge status: Home / Self Care | Payer: Commercial Managed Care - PPO | Source: Ambulatory Visit | Attending: Emergency Medicine | Admitting: Emergency Medicine

## 2022-03-16 DIAGNOSIS — Z1231 Encounter for screening mammogram for malignant neoplasm of breast: Secondary | ICD-10-CM

## 2022-03-19 ENCOUNTER — Encounter: Payer: Self-pay | Admitting: Family Medicine

## 2022-03-19 ENCOUNTER — Ambulatory Visit (INDEPENDENT_AMBULATORY_CARE_PROVIDER_SITE_OTHER): Payer: Commercial Managed Care - PPO | Admitting: Family Medicine

## 2022-03-19 VITALS — BP 116/70 | HR 72 | Temp 98.0°F | Ht 59.75 in | Wt 105.4 lb

## 2022-03-19 DIAGNOSIS — M810 Age-related osteoporosis without current pathological fracture: Secondary | ICD-10-CM | POA: Diagnosis not present

## 2022-03-19 DIAGNOSIS — E559 Vitamin D deficiency, unspecified: Secondary | ICD-10-CM

## 2022-03-19 DIAGNOSIS — E039 Hypothyroidism, unspecified: Secondary | ICD-10-CM

## 2022-03-19 DIAGNOSIS — Z0000 Encounter for general adult medical examination without abnormal findings: Secondary | ICD-10-CM

## 2022-03-19 DIAGNOSIS — G4730 Sleep apnea, unspecified: Secondary | ICD-10-CM

## 2022-03-19 LAB — CBC WITH DIFFERENTIAL/PLATELET
Basophils Absolute: 0 10*3/uL (ref 0.0–0.1)
Basophils Relative: 0.6 % (ref 0.0–3.0)
Eosinophils Absolute: 0.1 10*3/uL (ref 0.0–0.7)
Eosinophils Relative: 1.2 % (ref 0.0–5.0)
HCT: 39.7 % (ref 36.0–46.0)
Hemoglobin: 13.4 g/dL (ref 12.0–15.0)
Lymphocytes Relative: 28.2 % (ref 12.0–46.0)
Lymphs Abs: 1.6 10*3/uL (ref 0.7–4.0)
MCHC: 33.7 g/dL (ref 30.0–36.0)
MCV: 89.6 fl (ref 78.0–100.0)
Monocytes Absolute: 0.3 10*3/uL (ref 0.1–1.0)
Monocytes Relative: 4.8 % (ref 3.0–12.0)
Neutro Abs: 3.6 10*3/uL (ref 1.4–7.7)
Neutrophils Relative %: 65.2 % (ref 43.0–77.0)
Platelets: 276 10*3/uL (ref 150.0–400.0)
RBC: 4.43 Mil/uL (ref 3.87–5.11)
RDW: 14.2 % (ref 11.5–15.5)
WBC: 5.5 10*3/uL (ref 4.0–10.5)

## 2022-03-19 LAB — HEPATIC FUNCTION PANEL
ALT: 15 U/L (ref 0–35)
AST: 20 U/L (ref 0–37)
Albumin: 4.4 g/dL (ref 3.5–5.2)
Alkaline Phosphatase: 91 U/L (ref 39–117)
Bilirubin, Direct: 0.1 mg/dL (ref 0.0–0.3)
Total Bilirubin: 0.3 mg/dL (ref 0.2–1.2)
Total Protein: 7.2 g/dL (ref 6.0–8.3)

## 2022-03-19 LAB — LIPID PANEL
Cholesterol: 227 mg/dL — ABNORMAL HIGH (ref 0–200)
HDL: 94.2 mg/dL (ref >39.00)
LDL Cholesterol: 123 mg/dL — ABNORMAL HIGH (ref 0–99)
NonHDL: 133.14
Total CHOL/HDL Ratio: 2
Triglycerides: 51 mg/dL (ref 0.0–149.0)
VLDL: 10.2 mg/dL (ref 0.0–40.0)

## 2022-03-19 LAB — BASIC METABOLIC PANEL
BUN: 14 mg/dL (ref 6–23)
CO2: 30 mEq/L (ref 19–32)
Calcium: 10 mg/dL (ref 8.4–10.5)
Chloride: 105 mEq/L (ref 96–112)
Creatinine, Ser: 0.87 mg/dL (ref 0.40–1.20)
GFR: 74.1 mL/min (ref >60.00)
Glucose, Bld: 94 mg/dL (ref 70–99)
Potassium: 4.5 mEq/L (ref 3.5–5.1)
Sodium: 141 mEq/L (ref 135–145)

## 2022-03-19 LAB — HEMOGLOBIN A1C: Hgb A1c MFr Bld: 6.1 % (ref 4.6–6.5)

## 2022-03-19 MED ORDER — LEVOTHYROXINE SODIUM 75 MCG PO TABS: 75.0000 ug | ORAL_TABLET | Freq: Every day | ORAL | 3 refills | Status: DC

## 2022-03-19 NOTE — Progress Notes (Signed)
Subjective:    Patient ID: Sherri Charles, female    DOB: Aug 31, 1965, 56 y.o.   MRN: [id number]  HPI Here for a well exam. She has a few items to discuss. First she wants to go back to generic Levothyroxine. Second she thinks her blood oxygen levels drop during her sleep. She wears a Ecologist on her wrist, and according to this her oxygen drops a low as 77% during the night. Otherwise she seems to sleep well and she does not snore.    Review of Systems  Constitutional: Negative.   HENT: Negative.    Eyes: Negative.   Respiratory: Negative.    Cardiovascular: Negative.   Gastrointestinal: Negative.   Genitourinary:  Negative for decreased urine volume, difficulty urinating, dyspareunia, dysuria, enuresis, flank pain, frequency, hematuria, pelvic pain and urgency.  Musculoskeletal: Negative.   Skin: Negative.   Neurological: Negative.  Negative for headaches.  Psychiatric/Behavioral: Negative.         Objective:   Physical Exam Constitutional:      General: She is not in acute distress.    Appearance: Normal appearance. She is well-developed.  HENT:     Head: Normocephalic and atraumatic.     Right Ear: External ear normal.     Left Ear: External ear normal.     Nose: Nose normal.     Mouth/Throat:     Pharynx: No oropharyngeal exudate.  Eyes:     General: No scleral icterus.    Conjunctiva/sclera: Conjunctivae normal.     Pupils: Pupils are equal, round, and reactive to light.  Neck:     Thyroid: No thyromegaly.     Vascular: No JVD.  Cardiovascular:     Rate and Rhythm: Normal rate and regular rhythm.     Heart sounds: Normal heart sounds. No murmur heard.    No friction rub. No gallop.  Pulmonary:     Effort: Pulmonary effort is normal. No respiratory distress.     Breath sounds: Normal breath sounds. No wheezing or rales.  Chest:     Chest wall: No tenderness.  Abdominal:     General: Bowel sounds are normal. There is no distension.     Palpations: Abdomen is  soft. There is no mass.     Tenderness: There is no abdominal tenderness. There is no guarding or rebound.  Musculoskeletal:        General: No tenderness. Normal range of motion.     Cervical back: Normal range of motion and neck supple.  Lymphadenopathy:     Cervical: No cervical adenopathy.  Skin:    General: Skin is warm and dry.     Findings: No erythema or rash.  Neurological:     Mental Status: She is alert and oriented to person, place, and time.     Cranial Nerves: No cranial nerve deficit.     Motor: No abnormal muscle tone.     Coordination: Coordination normal.     Deep Tendon Reflexes: Reflexes are normal and symmetric. Reflexes normal.  Psychiatric:        Behavior: Behavior normal.        Thought Content: Thought content normal.        Judgment: Judgment normal.           Assessment & Plan:  Well exam. We discussed diet and exercise. Get fasting labs. Get another DEXA. I told her that the oxygen readings she has been getting are likely not to be accurate, but per  her request we will refer her for a sleep study. Alysia Penna, MD

## 2022-03-20 LAB — T3, FREE: T3, Free: 3.5 pg/mL (ref 2.3–4.2)

## 2022-03-20 LAB — TSH: TSH: 0.08 u[IU]/mL — ABNORMAL LOW (ref 0.35–5.50)

## 2022-03-20 LAB — T4, FREE: Free T4: 1.43 ng/dL (ref 0.60–1.60)

## 2022-03-20 LAB — VITAMIN D 25 HYDROXY (VIT D DEFICIENCY, FRACTURES): VITD: 57.85 ng/mL (ref 30.00–100.00)

## 2022-03-25 ENCOUNTER — Inpatient Hospital Stay: Admission: RE | Admit: 2022-03-25 | Payer: Commercial Managed Care - PPO | Source: Ambulatory Visit

## 2022-04-01 ENCOUNTER — Ambulatory Visit (INDEPENDENT_AMBULATORY_CARE_PROVIDER_SITE_OTHER)
Admission: RE | Admit: 2022-04-01 | Discharge: 2022-04-01 | Disposition: A | Discharge status: Home / Self Care | Payer: Commercial Managed Care - PPO | Source: Ambulatory Visit | Attending: Family Medicine | Admitting: Family Medicine

## 2022-04-01 DIAGNOSIS — M810 Age-related osteoporosis without current pathological fracture: Secondary | ICD-10-CM

## 2022-04-09 ENCOUNTER — Ambulatory Visit (INDEPENDENT_AMBULATORY_CARE_PROVIDER_SITE_OTHER): Payer: Commercial Managed Care - PPO | Admitting: Pulmonary Disease

## 2022-04-09 ENCOUNTER — Encounter (HOSPITAL_BASED_OUTPATIENT_CLINIC_OR_DEPARTMENT_OTHER): Payer: Self-pay | Admitting: Pulmonary Disease

## 2022-04-09 VITALS — BP 124/68 | HR 92 | Temp 98.2°F | Ht 60.0 in | Wt 104.6 lb

## 2022-04-09 DIAGNOSIS — G4734 Idiopathic sleep related nonobstructive alveolar hypoventilation: Secondary | ICD-10-CM | POA: Diagnosis not present

## 2022-04-09 DIAGNOSIS — F5101 Primary insomnia: Secondary | ICD-10-CM | POA: Diagnosis not present

## 2022-04-09 NOTE — Progress Notes (Signed)
Subjective:    Patient ID: Sherri Charles, female    DOB: 1965/09/25, 57 y.o.   MRN: [id number]  HPI  Chief Complaint  Patient presents with   Consult    Pt states she is having a hard time falling asleep for years.    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 She reports that for the last 5 years or so she has had difficulty falling asleep.  Insomnia seems to be mainly of sleep onset.  She has tried meditation reading and yoga to reduce stress levels but this has not helped. About 3-4 nights a week, sleep onset is delayed for 3 to 4 hours. Epworth sleepiness score is 8 and she reports some tiredness in the daytime while sitting and reading or as a passenger in a car but denies daytime naps. Bedtime is between 9 and 9:30 PM, sleep latency is variable, reports 2-4 nocturnal awakenings and is out of bed at 5:30 AM feeling rested without dryness of mouth or headaches.  Weight has not changed over the last few years. There is no history suggestive of cataplexy, sleep paralysis or parasomnias She denies snoring, gasping or choking episodes in her sleep  She has noticed on her iWatch that oxygen saturations can vary between 77 and 86% during sleep. She has been very particular about adhering to bedtimes and wake up times even on the weekends.  She Reports a family history of insomnia in her dad and siblings    Past Medical History:  Diagnosis Date   Dry eyes    sees Dr. Joya San   GERD (gastroesophageal reflux disease)    Hiatal hernia    IBS (irritable bowel syndrome)    Infertility management    sees Dr. Delila Pereyra   Osteopenia    per DEXA 09-28-07   Overactive bladder    sees Dr. Rosana Hoes   PONV (postoperative nausea and vomiting)    Positive ANA (antinuclear antibody)    Thyroid disease    Past Surgical History:  Procedure Laterality Date   ABDOMINAL HYSTERECTOMY  2009   TAH and left SO per Dr. Warnell Forester   CERVIX SURGERY     lesion removed from  cervix   COLONOSCOPY  09/24/2019   per Dr. Fuller Plan, adenomatous polyp, repeat in 7 yrs    ESOPHAGOGASTRODUODENOSCOPY  04/09/2014   per Dr. Fuller Plan, esophagitis, performed dilation    HERNIA REPAIR     right inguinal    LAPAROSCOPY     x 2   THYROIDECTOMY N/A 08/29/2014   Procedure: TOTAL THYROIDECTOMY;  Surgeon: Armandina Gemma, MD;  Location: [facility];  Service: General;  Laterality: N/A;   UPPER GASTROINTESTINAL ENDOSCOPY     URETHRAL DILATION     x two, (last 5-10)   Allergies  Allergen Reactions   Amoxicillin Rash   Sulfonamide Derivatives Rash    Social History   Socioeconomic History   Marital status: Married    Spouse name: Not on file   Number of children: 1 A   Years of education: Not on file   Highest education level: Not on file  Occupational History   Occupation: Finance    Employer: [employer]  Tobacco Use   Smoking status: Never    Passive exposure: Past   Smokeless tobacco: Never  Vaping Use   Vaping Use: Never used  Substance and Sexual Activity   Alcohol use: Yes    Alcohol/week: 0.0 standard drinks of alcohol    Comment: occ  Drug use: No   Sexual activity: Never    Birth control/protection: Abstinence  Other Topics Concern   Not on file  Social History Narrative   Adopted a child from [place] 2008   Social Determinants of Health   Financial Resource Strain: Not on file  Food Insecurity: Not on file  Transportation Needs: Not on file  Physical Activity: Not on file  Stress: Not on file  Social Connections: Not on file  Intimate Partner Violence: Not on file    Family History  Problem Relation Age of Onset   Alcohol abuse Mother    Heart disease Mother    Arthritis Father    Hypertension Father    Stomach cancer Paternal Grandmother    Ovarian cancer Paternal Aunt    Breast cancer Paternal Aunt    Colon cancer Neg Hx    Colon polyps Neg Hx    Esophageal cancer Neg Hx    Rectal cancer Neg Hx      Review of Systems Constitutional: negative  for anorexia, fevers and sweats  Eyes: negative for irritation, redness and visual disturbance  Ears, nose, mouth, throat, and face: negative for earaches, epistaxis, nasal congestion and sore throat  Respiratory: negative for cough, dyspnea on exertion, sputum and wheezing  Cardiovascular: negative for chest pain, dyspnea, lower extremity edema, orthopnea, palpitations and syncope  Gastrointestinal: negative for abdominal pain, constipation, diarrhea, melena, nausea and vomiting  Genitourinary:negative for dysuria, frequency and hematuria  Hematologic/lymphatic: negative for bleeding, easy bruising and lymphadenopathy  Musculoskeletal:negative for arthralgias, muscle weakness and stiff joints  Neurological: negative for coordination problems, gait problems, headaches and weakness  Endocrine: negative for diabetic symptoms including polydipsia, polyuria and weight loss     Objective:   Physical Exam  Gen. Pleasant, well-nourished, in no distress, normal affect ENT - no pallor,icterus, no post nasal drip Neck: No JVD, no thyromegaly, no carotid bruits Lungs: no use of accessory muscles, no dullness to percussion, clear without rales or rhonchi  Cardiovascular: Rhythm regular, heart sounds  normal, no murmurs or gallops, no peripheral edema Abdomen: soft and non-tender, no hepatosplenomegaly, BS normal. Musculoskeletal: No deformities, no cyanosis or clubbing Neuro:  alert, non focal       Assessment & Plan:   Sleep onset insomnia -appears to be sporadic and report recent onset so doubt  psychophysiologic.  No comorbidities such as depression or substance abuse  Rules of sleep hygiene were discussed  - light exercise -avoid caffeinated beverages - no more than 20 mins staying awake in bed, if not asleep, get out of bed & reading or light music - No TV or computer games at bedtime. -Fixed bedtime/ wake up time  X Home sleep test  we discussed sleep restriction as a form of  cognitive behavior therapy  Online programs for CBT are '[facility]' & '[facility]'   Nocturnal hypoxia -she does not have other red flags for OSA such as gasping or choking episodes or snoring.  But we should investigate with a home sleep test   Demarion Pondexter V. Elsworth Soho MD

## 2022-04-09 NOTE — Patient Instructions (Signed)
Rules of sleep hygiene were discussed  - light exercise -avoid caffeinated beverages - no more than 20 mins staying awake in bed, if not asleep, get out of bed & reading or light music - No TV or computer games at bedtime. -Fixed bedtime/ wake up time  X Home sleep test  we discussed sleep restriction as a form of cognitive behavior therapy  Online programs for CBT are '[facility]' & '[facility]'

## 2022-04-22 ENCOUNTER — Ambulatory Visit: Payer: Commercial Managed Care - PPO

## 2022-04-22 DIAGNOSIS — R0683 Snoring: Secondary | ICD-10-CM | POA: Diagnosis not present

## 2022-04-22 DIAGNOSIS — G4734 Idiopathic sleep related nonobstructive alveolar hypoventilation: Secondary | ICD-10-CM

## 2022-04-25 ENCOUNTER — Other Ambulatory Visit: Payer: Self-pay | Admitting: Family Medicine

## 2022-04-25 DIAGNOSIS — E039 Hypothyroidism, unspecified: Secondary | ICD-10-CM

## 2022-04-27 ENCOUNTER — Telehealth: Payer: Self-pay | Admitting: Pulmonary Disease

## 2022-04-27 DIAGNOSIS — R0683 Snoring: Secondary | ICD-10-CM | POA: Diagnosis not present

## 2022-04-27 NOTE — Telephone Encounter (Signed)
As expected, home sleep test did not show sleep apnea.  Very few events were noted.  Lowest saturation was 91% No intervention required

## 2022-07-27 ENCOUNTER — Encounter: Payer: Self-pay | Admitting: Family Medicine

## 2022-07-29 ENCOUNTER — Other Ambulatory Visit: Payer: Self-pay

## 2022-07-29 DIAGNOSIS — E039 Hypothyroidism, unspecified: Secondary | ICD-10-CM

## 2022-07-29 MED ORDER — SYNTHROID 75 MCG PO TABS
75.0000 ug | ORAL_TABLET | Freq: Every day | ORAL | 1 refills | Status: DC
Start: 2022-07-29 — End: 2022-07-30

## 2022-07-30 ENCOUNTER — Other Ambulatory Visit: Payer: Self-pay

## 2022-07-30 MED ORDER — LEVOTHYROXINE SODIUM 75 MCG PO TABS: 75.0000 ug | ORAL_TABLET | Freq: Every day | ORAL | 3 refills | Status: DC

## 2022-09-07 ENCOUNTER — Encounter: Payer: Self-pay | Admitting: Family Medicine

## 2022-09-07 ENCOUNTER — Ambulatory Visit (INDEPENDENT_AMBULATORY_CARE_PROVIDER_SITE_OTHER): Payer: Commercial Managed Care - PPO | Admitting: Family Medicine

## 2022-09-07 VITALS — BP 98/62 | HR 78 | Temp 98.1°F | Wt 106.0 lb

## 2022-09-07 DIAGNOSIS — R140 Abdominal distension (gaseous): Secondary | ICD-10-CM

## 2022-09-07 NOTE — Progress Notes (Signed)
   Subjective:    Patient ID: Sherri Charles, female    DOB: September 05, 1965, 57 y.o.   MRN: [id number]  HPI Here for 5 weeks of abdominal bloating and passing excess gas. No fever or nausea. No abdominal pain. She passes 1-2 stools every day. No urinary symptoms. Her last colonoscopy in 2021 showed some internal hemorrhoids and some polyps. No diverticula. She says her diet has not changed. No recent antibiotic use.    Review of Systems  Constitutional: Negative.   Respiratory: Negative.    Cardiovascular: Negative.   Gastrointestinal:  Positive for abdominal distention. Negative for abdominal pain, anal bleeding, blood in stool, constipation, diarrhea, nausea, rectal pain and vomiting.  Genitourinary: Negative.        Objective:   Physical Exam Constitutional:      General: She is not in acute distress.    Appearance: Normal appearance.  Cardiovascular:     Rate and Rhythm: Normal rate and regular rhythm.     Pulses: Normal pulses.     Heart sounds: Normal heart sounds.  Pulmonary:     Effort: Pulmonary effort is normal.     Breath sounds: Normal breath sounds.  Abdominal:     General: Bowel sounds are normal.     Palpations: There is no mass.     Tenderness: There is no abdominal tenderness. There is no guarding or rebound.     Hernia: No hernia is present.     Comments: She is mildly distended   Neurological:     Mental Status: She is alert.           Assessment & Plan:  This sounds like she has some IBS. I advised her to limit her intake of caffeine and alcohol. She will limit her intake of dairy products and of certain foods like beans, broccoli, etc. She will also stat taking a probiotic daily. Recheck as needed.  Gershon Crane, MD

## 2023-03-02 ENCOUNTER — Other Ambulatory Visit: Payer: Self-pay | Admitting: Family Medicine

## 2023-03-02 DIAGNOSIS — Z1231 Encounter for screening mammogram for malignant neoplasm of breast: Secondary | ICD-10-CM

## 2023-03-22 ENCOUNTER — Encounter: Payer: Commercial Managed Care - PPO | Admitting: Family Medicine

## 2023-04-01 ENCOUNTER — Encounter: Payer: Self-pay | Admitting: Family Medicine

## 2023-04-01 ENCOUNTER — Ambulatory Visit
Admission: RE | Admit: 2023-04-01 | Discharge: 2023-04-01 | Disposition: A | Discharge status: Home / Self Care | Payer: Commercial Managed Care - PPO | Source: Ambulatory Visit | Attending: Family Medicine | Admitting: Family Medicine

## 2023-04-01 ENCOUNTER — Ambulatory Visit (INDEPENDENT_AMBULATORY_CARE_PROVIDER_SITE_OTHER): Payer: Commercial Managed Care - PPO | Admitting: Family Medicine

## 2023-04-01 VITALS — BP 108/60 | HR 72 | Temp 98.1°F | Ht 59.75 in | Wt 105.2 lb

## 2023-04-01 DIAGNOSIS — E785 Hyperlipidemia, unspecified: Secondary | ICD-10-CM | POA: Diagnosis not present

## 2023-04-01 DIAGNOSIS — E039 Hypothyroidism, unspecified: Secondary | ICD-10-CM | POA: Diagnosis not present

## 2023-04-01 DIAGNOSIS — Z0000 Encounter for general adult medical examination without abnormal findings: Secondary | ICD-10-CM

## 2023-04-01 DIAGNOSIS — Z1231 Encounter for screening mammogram for malignant neoplasm of breast: Secondary | ICD-10-CM

## 2023-04-01 DIAGNOSIS — E559 Vitamin D deficiency, unspecified: Secondary | ICD-10-CM | POA: Diagnosis not present

## 2023-04-01 LAB — BASIC METABOLIC PANEL
BUN: 13 mg/dL (ref 6–23)
CO2: 27 meq/L (ref 19–32)
Calcium: 10.1 mg/dL (ref 8.4–10.5)
Chloride: 102 meq/L (ref 96–112)
Creatinine, Ser: 0.76 mg/dL (ref 0.40–1.20)
GFR: 86.52 mL/min (ref >60.00)
Glucose, Bld: 89 mg/dL (ref 70–99)
Potassium: 3.8 meq/L (ref 3.5–5.1)
Sodium: 139 meq/L (ref 135–145)

## 2023-04-01 LAB — CBC WITH DIFFERENTIAL/PLATELET
Basophils Absolute: 0 10*3/uL (ref 0.0–0.1)
Basophils Relative: 0.7 % (ref 0.0–3.0)
Eosinophils Absolute: 0.1 10*3/uL (ref 0.0–0.7)
Eosinophils Relative: 1.2 % (ref 0.0–5.0)
HCT: 41.9 % (ref 36.0–46.0)
Hemoglobin: 13.9 g/dL (ref 12.0–15.0)
Lymphocytes Relative: 23.6 % (ref 12.0–46.0)
Lymphs Abs: 1.2 10*3/uL (ref 0.7–4.0)
MCHC: 33.1 g/dL (ref 30.0–36.0)
MCV: 91.3 fL (ref 78.0–100.0)
Monocytes Absolute: 0.3 10*3/uL (ref 0.1–1.0)
Monocytes Relative: 4.9 % (ref 3.0–12.0)
Neutro Abs: 3.6 10*3/uL (ref 1.4–7.7)
Neutrophils Relative %: 69.6 % (ref 43.0–77.0)
Platelets: 263 10*3/uL (ref 150.0–400.0)
RBC: 4.59 Mil/uL (ref 3.87–5.11)
RDW: 14.6 % (ref 11.5–15.5)
WBC: 5.2 10*3/uL (ref 4.0–10.5)

## 2023-04-01 LAB — LIPID PANEL
Cholesterol: 264 mg/dL — ABNORMAL HIGH (ref 0–200)
HDL: 101 mg/dL (ref >39.00)
LDL Cholesterol: 150 mg/dL — ABNORMAL HIGH (ref 0–99)
NonHDL: 163.01
Total CHOL/HDL Ratio: 3
Triglycerides: 64 mg/dL (ref 0.0–149.0)
VLDL: 12.8 mg/dL (ref 0.0–40.0)

## 2023-04-01 LAB — T3, FREE: T3, Free: 3.3 pg/mL (ref 2.3–4.2)

## 2023-04-01 LAB — HEPATIC FUNCTION PANEL
ALT: 11 U/L (ref 0–35)
AST: 19 U/L (ref 0–37)
Albumin: 4.6 g/dL (ref 3.5–5.2)
Alkaline Phosphatase: 86 U/L (ref 39–117)
Bilirubin, Direct: 0.1 mg/dL (ref 0.0–0.3)
Total Bilirubin: 0.5 mg/dL (ref 0.2–1.2)
Total Protein: 7.1 g/dL (ref 6.0–8.3)

## 2023-04-01 LAB — HEMOGLOBIN A1C: Hgb A1c MFr Bld: 6.1 % (ref 4.6–6.5)

## 2023-04-01 LAB — T4, FREE: Free T4: 1.4 ng/dL (ref 0.60–1.60)

## 2023-04-01 LAB — VITAMIN D 25 HYDROXY (VIT D DEFICIENCY, FRACTURES): VITD: 56.19 ng/mL (ref 30.00–100.00)

## 2023-04-01 LAB — TSH: TSH: 0.09 u[IU]/mL — ABNORMAL LOW (ref 0.35–5.50)

## 2023-04-01 NOTE — Progress Notes (Signed)
   Subjective:    Patient ID: Sherri Charles, female    DOB: 1965/10/12, 58 y.o.   MRN: [id number]  HPI Here for a well exam. She is doing well. She is working with a nutritionist to find a diet that does not upset her stomach.    Review of Systems  Constitutional: Negative.   HENT: Negative.    Eyes: Negative.   Respiratory: Negative.    Cardiovascular: Negative.   Gastrointestinal: Negative.   Genitourinary:  Negative for decreased urine volume, difficulty urinating, dyspareunia, dysuria, enuresis, flank pain, frequency, hematuria, pelvic pain and urgency.  Musculoskeletal: Negative.   Skin: Negative.   Neurological: Negative.  Negative for headaches.  Psychiatric/Behavioral: Negative.         Objective:   Physical Exam Constitutional:      General: She is not in acute distress.    Appearance: Normal appearance. She is well-developed.  HENT:     Head: Normocephalic and atraumatic.     Right Ear: External ear normal.     Left Ear: External ear normal.     Nose: Nose normal.     Mouth/Throat:     Pharynx: No oropharyngeal exudate.  Eyes:     General: No scleral icterus.    Conjunctiva/sclera: Conjunctivae normal.     Pupils: Pupils are equal, round, and reactive to light.  Neck:     Thyroid : No thyromegaly.     Vascular: No JVD.  Cardiovascular:     Rate and Rhythm: Normal rate and regular rhythm.     Pulses: Normal pulses.     Heart sounds: Normal heart sounds. No murmur heard.    No friction rub. No gallop.  Pulmonary:     Effort: Pulmonary effort is normal. No respiratory distress.     Breath sounds: Normal breath sounds. No wheezing or rales.  Chest:     Chest wall: No tenderness.  Abdominal:     General: Bowel sounds are normal. There is no distension.     Palpations: Abdomen is soft. There is no mass.     Tenderness: There is no abdominal tenderness. There is no guarding or rebound.  Musculoskeletal:        General: No tenderness. Normal range of motion.      Cervical back: Normal range of motion and neck supple.  Lymphadenopathy:     Cervical: No cervical adenopathy.  Skin:    General: Skin is warm and dry.     Findings: No erythema or rash.  Neurological:     General: No focal deficit present.     Mental Status: She is alert and oriented to person, place, and time.     Cranial Nerves: No cranial nerve deficit.     Motor: No abnormal muscle tone.     Coordination: Coordination normal.     Deep Tendon Reflexes: Reflexes are normal and symmetric. Reflexes normal.  Psychiatric:        Mood and Affect: Mood normal.        Behavior: Behavior normal.        Thought Content: Thought content normal.        Judgment: Judgment normal.           Assessment & Plan:  Well exam. We discussed diet and exercise. Get fasting labs. Garnette Olmsted, MD

## 2023-04-02 LAB — APOLIPOPROTEIN B: Apolipoprotein B: 112 mg/dL — ABNORMAL HIGH (ref <90)

## 2023-04-04 ENCOUNTER — Other Ambulatory Visit: Payer: Self-pay

## 2023-04-04 MED ORDER — ATORVASTATIN CALCIUM 20 MG PO TABS: 20.0000 mg | ORAL_TABLET | Freq: Every day | ORAL | 3 refills | Status: DC

## 2023-04-06 LAB — VITAMIN D 1,25 DIHYDROXY
Vitamin D 1, 25 (OH)2 Total: 39 pg/mL (ref 18–72)
Vitamin D2 1, 25 (OH)2: <8 pg/mL
Vitamin D3 1, 25 (OH)2: 39 pg/mL

## 2023-07-10 ENCOUNTER — Encounter: Payer: Self-pay | Admitting: Family Medicine

## 2023-07-10 DIAGNOSIS — E785 Hyperlipidemia, unspecified: Secondary | ICD-10-CM

## 2023-07-26 ENCOUNTER — Other Ambulatory Visit (INDEPENDENT_AMBULATORY_CARE_PROVIDER_SITE_OTHER)

## 2023-07-26 DIAGNOSIS — E785 Hyperlipidemia, unspecified: Secondary | ICD-10-CM

## 2023-07-26 LAB — LIPID PANEL
Cholesterol: 243 mg/dL — ABNORMAL HIGH (ref 0–200)
HDL: 103.9 mg/dL (ref >39.00)
LDL Cholesterol: 127 mg/dL — ABNORMAL HIGH (ref 0–99)
NonHDL: 139.06
Total CHOL/HDL Ratio: 2
Triglycerides: 61 mg/dL (ref 0.0–149.0)
VLDL: 12.2 mg/dL (ref 0.0–40.0)

## 2023-07-28 ENCOUNTER — Other Ambulatory Visit: Payer: Self-pay | Admitting: *Deleted

## 2023-07-28 DIAGNOSIS — E785 Hyperlipidemia, unspecified: Secondary | ICD-10-CM

## 2023-07-28 MED ORDER — ATORVASTATIN CALCIUM 20 MG PO TABS: 20.0000 mg | ORAL_TABLET | Freq: Every day | ORAL | 3 refills | Status: AC

## 2023-08-14 ENCOUNTER — Other Ambulatory Visit: Payer: Self-pay | Admitting: Family Medicine

## 2023-10-31 ENCOUNTER — Other Ambulatory Visit (INDEPENDENT_AMBULATORY_CARE_PROVIDER_SITE_OTHER)

## 2023-10-31 DIAGNOSIS — E785 Hyperlipidemia, unspecified: Secondary | ICD-10-CM

## 2023-10-31 LAB — LIPID PANEL
Cholesterol: 175 mg/dL (ref 0–200)
HDL: 94.5 mg/dL
LDL Cholesterol: 71 mg/dL (ref 0–99)
NonHDL: 80.98
Total CHOL/HDL Ratio: 2
Triglycerides: 51 mg/dL (ref 0.0–149.0)
VLDL: 10.2 mg/dL (ref 0.0–40.0)

## 2023-11-01 ENCOUNTER — Ambulatory Visit: Payer: Self-pay | Admitting: Family Medicine

## 2024-02-24 ENCOUNTER — Other Ambulatory Visit: Payer: Self-pay | Admitting: Family Medicine

## 2024-04-13 ENCOUNTER — Other Ambulatory Visit: Payer: Self-pay | Admitting: Family Medicine

## 2024-04-13 DIAGNOSIS — Z1231 Encounter for screening mammogram for malignant neoplasm of breast: Secondary | ICD-10-CM

## 2024-04-17 ENCOUNTER — Encounter: Admitting: Family Medicine

## 2024-04-24 ENCOUNTER — Ambulatory Visit (INDEPENDENT_AMBULATORY_CARE_PROVIDER_SITE_OTHER): Admitting: Family Medicine

## 2024-04-24 ENCOUNTER — Encounter: Payer: Self-pay | Admitting: Family Medicine

## 2024-04-24 VITALS — BP 118/72 | HR 66 | Temp 98.0°F | Ht 59.5 in | Wt 105.0 lb

## 2024-04-24 DIAGNOSIS — M26653 Arthropathy of bilateral temporomandibular joint: Secondary | ICD-10-CM | POA: Insufficient documentation

## 2024-04-24 DIAGNOSIS — Z0000 Encounter for general adult medical examination without abnormal findings: Secondary | ICD-10-CM

## 2024-04-24 DIAGNOSIS — E039 Hypothyroidism, unspecified: Secondary | ICD-10-CM | POA: Diagnosis not present

## 2024-04-24 MED ORDER — LEVOTHYROXINE SODIUM 75 MCG PO TABS: 75.0000 ug | ORAL_TABLET | Freq: Every day | ORAL | 3 refills | Status: AC

## 2024-04-24 NOTE — Progress Notes (Signed)
 "  Subjective:    Patient ID: Sherri Charles, female    DOB: [birth year], 59 y.o.   MRN: [id number]  HPI Here for a well exam. She feels well in general, but she does complain of a 2 month hx of popping and grinding in her jaws. Occasionally her mouth gets stuck when she opens it wide. It is quite painful when this occurs. No pain with chewing.   Review of Systems  Constitutional: Negative.   HENT: Negative.    Eyes: Negative.   Respiratory: Negative.    Cardiovascular: Negative.   Gastrointestinal: Negative.   Genitourinary:  Negative for decreased urine volume, difficulty urinating, dyspareunia, dysuria, enuresis, flank pain, frequency, hematuria, pelvic pain and urgency.  Musculoskeletal: Negative.   Skin: Negative.   Neurological: Negative.  Negative for headaches.  Psychiatric/Behavioral: Negative.         Objective:   Physical Exam Constitutional:      General: She is not in acute distress.    Appearance: Normal appearance. She is well-developed.  HENT:     Head: Normocephalic and atraumatic.     Right Ear: External ear normal.     Left Ear: External ear normal.     Nose: Nose normal.     Mouth/Throat:     Pharynx: No oropharyngeal exudate.     Comments: Her TMJ joints are not tender, but both of them have a lot of crepitus  Eyes:     General: No scleral icterus.    Conjunctiva/sclera: Conjunctivae normal.     Pupils: Pupils are equal, round, and reactive to light.  Neck:     Thyroid : No thyromegaly.     Vascular: No JVD.  Cardiovascular:     Rate and Rhythm: Normal rate and regular rhythm.     Pulses: Normal pulses.     Heart sounds: Normal heart sounds. No murmur heard.    No friction rub. No gallop.  Pulmonary:     Effort: Pulmonary effort is normal. No respiratory distress.     Breath sounds: Normal breath sounds. No wheezing or rales.  Chest:     Chest wall: No tenderness.  Abdominal:     General: Bowel sounds are normal. There is no distension.      Palpations: Abdomen is soft. There is no mass.     Tenderness: There is no abdominal tenderness. There is no guarding or rebound.  Musculoskeletal:        General: No tenderness. Normal range of motion.     Cervical back: Normal range of motion and neck supple.  Lymphadenopathy:     Cervical: No cervical adenopathy.  Skin:    General: Skin is warm and dry.     Findings: No erythema or rash.  Neurological:     General: No focal deficit present.     Mental Status: She is alert and oriented to person, place, and time.     Cranial Nerves: No cranial nerve deficit.     Motor: No abnormal muscle tone.     Coordination: Coordination normal.     Deep Tendon Reflexes: Reflexes are normal and symmetric. Reflexes normal.  Psychiatric:        Mood and Affect: Mood normal.        Behavior: Behavior normal.        Thought Content: Thought content normal.        Judgment: Judgment normal.           Assessment & Plan:  Well exam.  We discussed diet and exercise. Get fasting labs. She has TMJ arthropathy, so I advised her to avoid opening her mouth wide or taking large bites of food. I advised her to tell her dentist about this.  Garnette Olmsted, MD    "

## 2024-04-25 LAB — LIPID PANEL
Cholesterol: 160 mg/dL (ref 28–200)
HDL: 88.2 mg/dL
LDL Cholesterol: 60 mg/dL (ref 10–99)
NonHDL: 72.19
Total CHOL/HDL Ratio: 2
Triglycerides: 62 mg/dL (ref 10.0–149.0)
VLDL: 12.4 mg/dL (ref 0.0–40.0)

## 2024-04-25 LAB — CBC WITH DIFFERENTIAL/PLATELET
Basophils Absolute: 0 10*3/uL (ref 0.0–0.1)
Basophils Relative: 0.8 % (ref 0.0–3.0)
Eosinophils Absolute: 0.1 10*3/uL (ref 0.0–0.7)
Eosinophils Relative: 1.1 % (ref 0.0–5.0)
HCT: 39.7 % (ref 36.0–46.0)
Hemoglobin: 13.2 g/dL (ref 12.0–15.0)
Lymphocytes Relative: 28.4 % (ref 12.0–46.0)
Lymphs Abs: 1.5 10*3/uL (ref 0.7–4.0)
MCHC: 33.4 g/dL (ref 30.0–36.0)
MCV: 90 fl (ref 78.0–100.0)
Monocytes Absolute: 0.3 10*3/uL (ref 0.1–1.0)
Monocytes Relative: 6.2 % (ref 3.0–12.0)
Neutro Abs: 3.4 10*3/uL (ref 1.4–7.7)
Neutrophils Relative %: 63.5 % (ref 43.0–77.0)
Platelets: 218 10*3/uL (ref 150.0–400.0)
RBC: 4.41 Mil/uL (ref 3.87–5.11)
RDW: 14.8 % (ref 11.5–15.5)
WBC: 5.3 10*3/uL (ref 4.0–10.5)

## 2024-04-25 LAB — TSH: TSH: 0.08 u[IU]/mL — ABNORMAL LOW (ref 0.35–5.50)

## 2024-04-25 LAB — HEPATIC FUNCTION PANEL
ALT: 17 U/L (ref 3–35)
AST: 25 U/L (ref 5–37)
Albumin: 4.5 g/dL (ref 3.5–5.2)
Alkaline Phosphatase: 76 U/L (ref 39–117)
Bilirubin, Direct: 0.1 mg/dL (ref 0.1–0.3)
Total Bilirubin: 0.5 mg/dL (ref 0.2–1.2)
Total Protein: 7.4 g/dL (ref 6.0–8.3)

## 2024-04-25 LAB — BASIC METABOLIC PANEL WITH GFR
BUN: 11 mg/dL (ref 6–23)
CO2: 30 meq/L (ref 19–32)
Calcium: 10.2 mg/dL (ref 8.4–10.5)
Chloride: 103 meq/L (ref 96–112)
Creatinine, Ser: 0.8 mg/dL (ref 0.40–1.20)
GFR: 80.74 mL/min
Glucose, Bld: 87 mg/dL (ref 70–99)
Potassium: 4.4 meq/L (ref 3.5–5.1)
Sodium: 140 meq/L (ref 135–145)

## 2024-04-25 LAB — T3, FREE: T3, Free: 3 pg/mL (ref 2.3–4.2)

## 2024-04-25 LAB — HEMOGLOBIN A1C: Hgb A1c MFr Bld: 6.2 % (ref 4.6–6.5)

## 2024-04-25 LAB — T4, FREE: Free T4: 1.31 ng/dL (ref 0.60–1.60)

## 2024-04-26 ENCOUNTER — Ambulatory Visit: Payer: Self-pay | Admitting: Family Medicine

## 2024-05-02 ENCOUNTER — Ambulatory Visit
Admission: RE | Admit: 2024-05-02 | Discharge: 2024-05-02 | Disposition: A | Discharge status: Home / Self Care | Source: Ambulatory Visit | Attending: Family Medicine | Admitting: Family Medicine

## 2024-05-02 DIAGNOSIS — Z1231 Encounter for screening mammogram for malignant neoplasm of breast: Secondary | ICD-10-CM
